# Patient Record
Sex: Male | Born: 1960 | Race: White | Hispanic: No | Marital: Married | Smoking: Never smoker
Health system: Southern US, Community
[De-identification: ages and names within clinical notes are randomized; demographics above are authoritative.]

## PROBLEM LIST (undated history)

## (undated) DIAGNOSIS — Z9889 Other specified postprocedural states: Secondary | ICD-10-CM

## (undated) DIAGNOSIS — I5032 Chronic diastolic (congestive) heart failure: Secondary | ICD-10-CM

## (undated) DIAGNOSIS — E119 Type 2 diabetes mellitus without complications: Secondary | ICD-10-CM

## (undated) DIAGNOSIS — E876 Hypokalemia: Secondary | ICD-10-CM

## (undated) DIAGNOSIS — Z794 Long term (current) use of insulin: Secondary | ICD-10-CM

## (undated) DIAGNOSIS — Z87442 Personal history of urinary calculi: Secondary | ICD-10-CM

## (undated) DIAGNOSIS — G4733 Obstructive sleep apnea (adult) (pediatric): Secondary | ICD-10-CM

## (undated) DIAGNOSIS — I10 Essential (primary) hypertension: Secondary | ICD-10-CM

## (undated) DIAGNOSIS — R351 Nocturia: Secondary | ICD-10-CM

## (undated) DIAGNOSIS — Z859 Personal history of malignant neoplasm, unspecified: Secondary | ICD-10-CM

## (undated) HISTORY — PX: NEPHRECTOMY: SHX65

## (undated) HISTORY — PX: OTHER SURGICAL HISTORY: SHX169

---

## 2018-08-31 ENCOUNTER — Encounter (HOSPITAL_COMMUNITY): Payer: Self-pay | Admitting: *Deleted

## 2018-08-31 ENCOUNTER — Emergency Department (HOSPITAL_COMMUNITY): Payer: BLUE CROSS/BLUE SHIELD

## 2018-08-31 ENCOUNTER — Emergency Department (HOSPITAL_COMMUNITY)
Admission: EM | Admit: 2018-08-31 | Discharge: 2018-08-31 | Disposition: A | Discharge status: Home / Self Care | Payer: BLUE CROSS/BLUE SHIELD | Attending: Emergency Medicine | Admitting: Emergency Medicine

## 2018-08-31 DIAGNOSIS — E1165 Type 2 diabetes mellitus with hyperglycemia: Secondary | ICD-10-CM | POA: Diagnosis not present

## 2018-08-31 DIAGNOSIS — R112 Nausea with vomiting, unspecified: Secondary | ICD-10-CM | POA: Diagnosis not present

## 2018-08-31 DIAGNOSIS — N179 Acute kidney failure, unspecified: Secondary | ICD-10-CM

## 2018-08-31 DIAGNOSIS — R197 Diarrhea, unspecified: Secondary | ICD-10-CM | POA: Insufficient documentation

## 2018-08-31 DIAGNOSIS — R739 Hyperglycemia, unspecified: Secondary | ICD-10-CM

## 2018-08-31 DIAGNOSIS — R109 Unspecified abdominal pain: Secondary | ICD-10-CM | POA: Diagnosis present

## 2018-08-31 DIAGNOSIS — E876 Hypokalemia: Secondary | ICD-10-CM

## 2018-08-31 DIAGNOSIS — I10 Essential (primary) hypertension: Secondary | ICD-10-CM | POA: Diagnosis not present

## 2018-08-31 HISTORY — DX: Essential (primary) hypertension: I10

## 2018-08-31 HISTORY — DX: Type 2 diabetes mellitus without complications: E11.9

## 2018-08-31 LAB — COMPREHENSIVE METABOLIC PANEL
ALT: 19 U/L (ref 0–44)
AST: 18 U/L (ref 15–41)
Albumin: 3.1 g/dL — ABNORMAL LOW (ref 3.5–5.0)
Alkaline Phosphatase: 72 U/L (ref 38–126)
Anion gap: 14 (ref 5–15)
BUN: 19 mg/dL (ref 6–20)
CO2: 26 mmol/L (ref 22–32)
Calcium: 8.6 mg/dL — ABNORMAL LOW (ref 8.9–10.3)
Chloride: 95 mmol/L — ABNORMAL LOW (ref 98–111)
Creatinine, Ser: 1.44 mg/dL — ABNORMAL HIGH (ref 0.61–1.24)
GFR calc Af Amer: >60 mL/min (ref >60)
GFR, EST NON AFRICAN AMERICAN: 54 mL/min — AB (ref >60)
Glucose, Bld: 342 mg/dL — ABNORMAL HIGH (ref 70–99)
POTASSIUM: 3.1 mmol/L — AB (ref 3.5–5.1)
Sodium: 135 mmol/L (ref 135–145)
Total Bilirubin: 0.7 mg/dL (ref 0.3–1.2)
Total Protein: 6.8 g/dL (ref 6.5–8.1)

## 2018-08-31 LAB — URINALYSIS, ROUTINE W REFLEX MICROSCOPIC
Bilirubin Urine: NEGATIVE
Hgb urine dipstick: NEGATIVE
Ketones, ur: NEGATIVE mg/dL
Leukocytes, UA: NEGATIVE
NITRITE: NEGATIVE
Protein, ur: 30 mg/dL — AB
Specific Gravity, Urine: 1.024 (ref 1.005–1.030)
pH: 5 (ref 5.0–8.0)

## 2018-08-31 LAB — CBC
HCT: 48.9 % (ref 39.0–52.0)
Hemoglobin: 15.8 g/dL (ref 13.0–17.0)
MCH: 26.9 pg (ref 26.0–34.0)
MCHC: 32.3 g/dL (ref 30.0–36.0)
MCV: 83.2 fL (ref 80.0–100.0)
Platelets: 396 10*3/uL (ref 150–400)
RBC: 5.88 MIL/uL — AB (ref 4.22–5.81)
RDW: 13.3 % (ref 11.5–15.5)
WBC: 13 10*3/uL — ABNORMAL HIGH (ref 4.0–10.5)
nRBC: 0 % (ref 0.0–0.2)

## 2018-08-31 LAB — LIPASE, BLOOD: Lipase: 27 U/L (ref 11–51)

## 2018-08-31 LAB — CBG MONITORING, ED: Glucose-Capillary: 293 mg/dL — ABNORMAL HIGH (ref 70–99)

## 2018-08-31 MED ORDER — ONDANSETRON 4 MG PO TBDP: 4.0000 mg | ORAL_TABLET | Freq: Three times a day (TID) | ORAL | 0 refills | Status: DC | PRN

## 2018-08-31 MED ORDER — IOHEXOL 300 MG/ML  SOLN
100.0000 mL | Freq: Once | INTRAMUSCULAR | Status: AC | PRN
  Administered 2018-08-31: 100 mL via INTRAVENOUS

## 2018-08-31 MED ORDER — POTASSIUM CHLORIDE CRYS ER 20 MEQ PO TBCR
40.0000 meq | EXTENDED_RELEASE_TABLET | Freq: Once | ORAL | Status: AC
  Administered 2018-08-31: 40 meq via ORAL
  Filled 2018-08-31: qty 2

## 2018-08-31 MED ORDER — POTASSIUM CHLORIDE CRYS ER 20 MEQ PO TBCR: 40.0000 meq | EXTENDED_RELEASE_TABLET | Freq: Every day | ORAL | 0 refills | Status: DC

## 2018-08-31 MED ORDER — SODIUM CHLORIDE 0.9 % IV BOLUS
1000.0000 mL | Freq: Once | INTRAVENOUS | Status: AC
  Administered 2018-08-31: 1000 mL via INTRAVENOUS

## 2018-08-31 MED ORDER — SODIUM CHLORIDE 0.9% FLUSH
3.0000 mL | Freq: Once | INTRAVENOUS | Status: AC
  Administered 2018-08-31: 3 mL via INTRAVENOUS

## 2018-08-31 NOTE — ED Triage Notes (Signed)
Pt in c/o n/v/d onset x 4 days with vomiting and diarrhea onset x 3 days, pt in c/o 15 or more loose stools in the last 24 hrs, pt A&O x4

## 2018-08-31 NOTE — ED Provider Notes (Signed)
[facility] EMERGENCY DEPARTMENT Provider Note   CSN: [id number] Arrival date & time: 08/31/18  1326     History   Chief Complaint Chief Complaint  Patient presents with  . Abdominal Pain    HPI Travis Deleon is a 58 y.o. male who presents with nausea, vomiting, diarrhea.  Past medical history significant for insulin-dependent diabetes, hypertension, morbid obesity, sleep apnea.  He states that he has not been feeling well since Sunday.  He is also had intermittent lower abdominal pain and chills.  Yesterday he woke up and states he had multiple episodes of diarrhea.  He estimates it is been about 20 episodes.  It is watery and nonbloody.  He has had several episodes of vomiting as well.  He started to get lightheaded and therefore he told his wife and they decided to come to the ED.  In October 2018 he was admitted for 4 days due to this same problem because of dehydration, AKI and electrolyte imbalances.  He had a normal colonoscopy in June.  Denies prior abdominal surgeries.  He denies any abdominal pain currently and states that he has abdominal pain when he has to have diarrhea.  He denies any known sick contacts.  He has not had any recent antibiotics and denies any travel.  He denies fever, chest pain, shortness of breath, decreased urination.  He has not been checking his blood sugars but has been taking his insulin and medications.  HPI  Past Medical History:  Diagnosis Date  . Diabetes mellitus without complication ([place])   . Hypertension     There are no active problems to display for this patient.   Past Surgical History:  Procedure Laterality Date  . skin cancer removal          Home Medications    Prior to Admission medications   Not on File    Family History No family history on file.  Social History Social History   Tobacco Use  . Smoking status: Never Smoker  . Smokeless tobacco: Never Used  Substance Use Topics  . Alcohol use: Not  Currently  . Drug use: Not Currently     Allergies   Patient has no known allergies.   Review of Systems Review of Systems  Constitutional: Positive for chills. Negative for fever.  Respiratory: Negative for shortness of breath.   Cardiovascular: Negative for chest pain.  Gastrointestinal: Positive for abdominal pain, diarrhea, nausea and vomiting. Negative for blood in stool and constipation.  Genitourinary: Negative for decreased urine volume, difficulty urinating and dysuria.  Musculoskeletal: Negative for back pain.  Neurological: Positive for light-headedness. Negative for syncope and weakness.  All other systems reviewed and are negative.    Physical Exam Updated Vital Signs BP 104/72   Pulse 83   Temp 98.7 F (37.1 C)   Resp 18   SpO2 99%   Physical Exam Vitals signs and nursing note reviewed.  Constitutional:      General: He is not in acute distress.    Appearance: He is well-developed. He is obese.     Comments: Calm, cooperative.  No acute distress.  Pleasant  HENT:     Head: Normocephalic and atraumatic.  Eyes:     General: No scleral icterus.       Right eye: No discharge.        Left eye: No discharge.     Conjunctiva/sclera: Conjunctivae normal.     Pupils: Pupils are equal, round, and reactive to  light.  Neck:     Musculoskeletal: Normal range of motion.  Cardiovascular:     Rate and Rhythm: Normal rate and regular rhythm.  Pulmonary:     Effort: Pulmonary effort is normal. No respiratory distress.     Breath sounds: Normal breath sounds.  Abdominal:     General: Abdomen is protuberant. There is no distension.     Tenderness: There is abdominal tenderness in the left upper quadrant and left lower quadrant.     Hernia: No hernia is present.     Comments: Difficult exam due to obesity.  He has left upper quadrant and left lower quadrant tenderness  Skin:    General: Skin is warm and dry.  Neurological:     Mental Status: He is alert and  oriented to person, place, and time.  Psychiatric:        Behavior: Behavior normal.      ED Treatments / Results  Labs (all labs ordered are listed, but only abnormal results are displayed) Labs Reviewed  COMPREHENSIVE METABOLIC PANEL - Abnormal; Notable for the following components:      Result Value   Potassium 3.1 (*)    Chloride 95 (*)    Glucose, Bld 342 (*)    Creatinine, Ser 1.44 (*)    Calcium 8.6 (*)    Albumin 3.1 (*)    GFR calc non Af Amer 54 (*)    All other components within normal limits  CBC - Abnormal; Notable for the following components:   WBC 13.0 (*)    RBC 5.88 (*)    All other components within normal limits  URINALYSIS, ROUTINE W REFLEX MICROSCOPIC - Abnormal; Notable for the following components:   Glucose, UA >=500 (*)    Protein, ur 30 (*)    Bacteria, UA RARE (*)    All other components within normal limits  CBG MONITORING, ED - Abnormal; Notable for the following components:   Glucose-Capillary 293 (*)    All other components within normal limits  LIPASE, BLOOD    EKG None  Radiology Ct Abdomen Pelvis W Contrast  Result Date: 08/31/2018 CLINICAL DATA:  58 year old male with 4 days of nausea, vomiting, diarrhea and abdominal pain. Suspect diverticulitis. EXAM: CT ABDOMEN AND PELVIS WITH CONTRAST TECHNIQUE: Multidetector CT imaging of the abdomen and pelvis was performed using the standard protocol following bolus administration of intravenous contrast. CONTRAST:  117mL OMNIPAQUE IOHEXOL 300 MG/ML  SOLN COMPARISON:  None. FINDINGS: Lower chest: The lung bases are clear. Visualized cardiac structures are within normal limits for size. No pericardial effusion. Unremarkable visualized distal thoracic esophagus. Hepatobiliary: Normal hepatic contour and morphology. No discrete hepatic lesions. Normal appearance of the gallbladder. No intra or extrahepatic biliary ductal dilatation. Pancreas: Unremarkable. No pancreatic ductal dilatation or surrounding  inflammatory changes. Spleen: Normal in size without focal abnormality. Adrenals/Urinary Tract: Nonspecific 1.4 cm right adrenal nodule. Chronic atrophy of the left kidney with a staghorn calculus occupying the majority of the renal pelvis. The right kidney is mildly hypertrophic in comparison. There is a single 2-3 mm nonobstructing stone in the upper pole collecting system. No evidence of hydronephrosis. The ureters and bladder are unremarkable. Stomach/Bowel: No evidence of obstruction or focal bowel wall thickening. Normal appendix in the right lower quadrant. The terminal ileum is unremarkable. Vascular/Lymphatic: No significant vascular findings are present. No enlarged abdominal or pelvic lymph nodes. Reproductive: Prostate is unremarkable. Other: No abdominal wall hernia or abnormality. No abdominopelvic ascites. Musculoskeletal: No acute fracture or  aggressive appearing lytic or blastic osseous lesion. IMPRESSION: 1. No acute abnormality within the abdomen or pelvis. Specifically, no evidence of diverticulosis or diverticulitis. 2. 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 3. Solitary 3 mm nonobstructing stone in the upper pole collecting system of the right kidney. 4. Nonspecific 1.4 cm right adrenal nodule. In the absence of a known primary malignancy this is statistically likely an incidental adenoma. Electronically Signed   By: Jacqulynn Cadet M.D.   On: 08/31/2018 18:53    Procedures Procedures (including critical care time)  Medications Ordered in ED Medications  sodium chloride flush (NS) 0.9 % injection 3 mL (3 mLs Intravenous Given 08/31/18 1715)  sodium chloride 0.9 % bolus 1,000 mL (0 mLs Intravenous Stopped 08/31/18 1832)  potassium chloride SA (K-DUR,KLOR-CON) CR tablet 40 mEq (40 mEq Oral Given 08/31/18 1904)  iohexol (OMNIPAQUE) 300 MG/ML solution 100 mL (100 mLs Intravenous Contrast Given 08/31/18 1835)  sodium chloride 0.9 % bolus 1,000 mL (0 mLs  Intravenous Stopped 08/31/18 2228)     Initial Impression / Assessment and Plan / ED Course  I have reviewed the triage vital signs and the nursing notes.  Pertinent labs & imaging results that were available during my care of the patient were reviewed by me and considered in my medical decision making (see chart for details).  58 year old male presents with abdominal pain, N/V/D. His vitals are normal. BP is somewhat soft here. On exam heart is regular rate and rhythm. Lungs a CTA. Abdomen is soft, and there is mild left upper and lower tenderness. Labs, UA pending. Fluids and CT abdomen ordered.  CBC is remarkable for leukocytosis (13). CMP is remarkable for hypokalemia (3.1), hyperglycemia (342), elevated SCr (1.44). UA has >500 glucose. CT abdomen is negative for acute process. Repeat CBG is 293. He feels better but still feels overall bad. He tolerated PO here but is still having ongoing diarrhea. He was given rx for Zofran and potassium and advised to f/u with his doctor for recheck of his kidney function and potassium. He was also advised to take Imodium if diarrhea isn't improving. Strict return precautions given.  Final Clinical Impressions(s) / ED Diagnoses   Final diagnoses:  AKI (acute kidney injury) ([place])  Nausea vomiting and diarrhea  Hypokalemia  Hyperglycemia    ED Discharge Orders    None       Recardo Evangelist, PA-C 08/31/18 2341    Little, Wenda Overland, MD 09/01/18 1102

## 2018-08-31 NOTE — Discharge Instructions (Signed)
Take potassium for the next 5 days Please make an appointment with your doctor to get your kidney function, potassium levels, and blood sugar rechecked Start Imodium if you are not improving by the weekend Drink plenty of fluids Return if worsening

## 2019-01-23 DIAGNOSIS — Z905 Acquired absence of kidney: Secondary | ICD-10-CM

## 2019-01-23 HISTORY — PX: LAPAROSCOPIC NEPHRECTOMY, HAND ASSISTED: SHX1929

## 2019-01-23 HISTORY — DX: Acquired absence of kidney: Z90.5

## 2020-03-31 IMAGING — CT CT ABD-PELV W/ CM
2 of 5 series · 16 of 46 positions shown, 18 images · IV contrast (omnipaque)
Comparison: None.

CLINICAL DATA: 57-year-old male with 4 days of nausea, vomiting,
diarrhea and abdominal pain. Suspect diverticulitis.

EXAM:
CT ABDOMEN AND PELVIS WITH CONTRAST
TECHNIQUE: Multidetector CT imaging of the abdomen and pelvis was performed
using the standard protocol following bolus administration of
intravenous contrast.
CONTRAST:  100mL OMNIPAQUE IOHEXOL 300 MG/ML  SOLN

[Series 3: abd/ pelvis 5.0 i30f 2 · axial · 0.98mm/px · z∈[+777,+1327]mm · 13 of 124 slices shown, 15 images]
[im 7/124  soft-tissue]
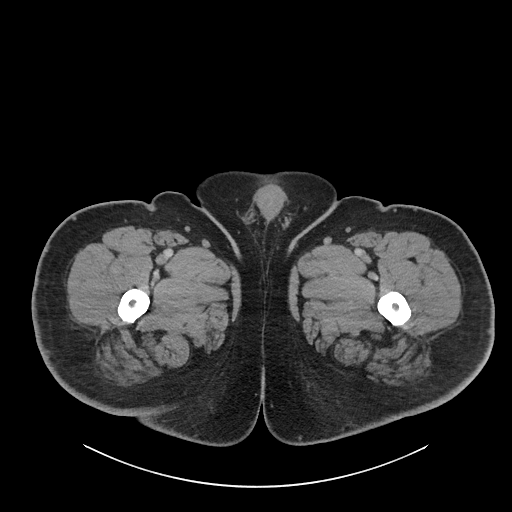
[im 7/124  bone]
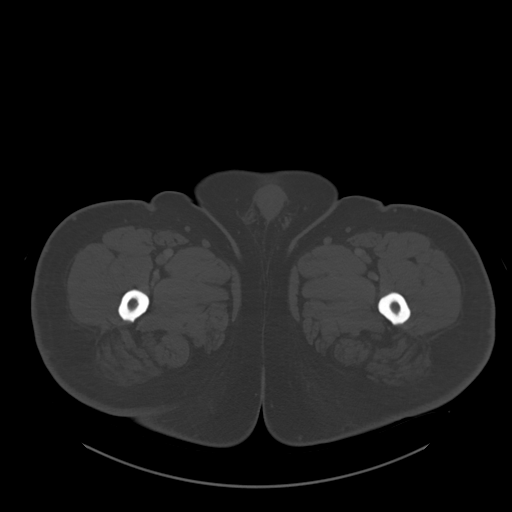
[im 20/124  soft-tissue]
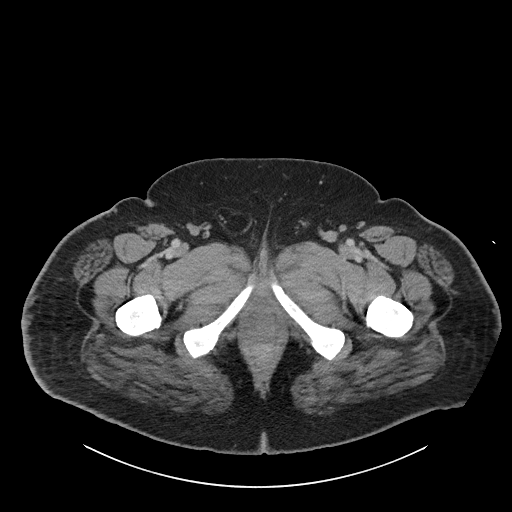
[im 26/124  soft-tissue]
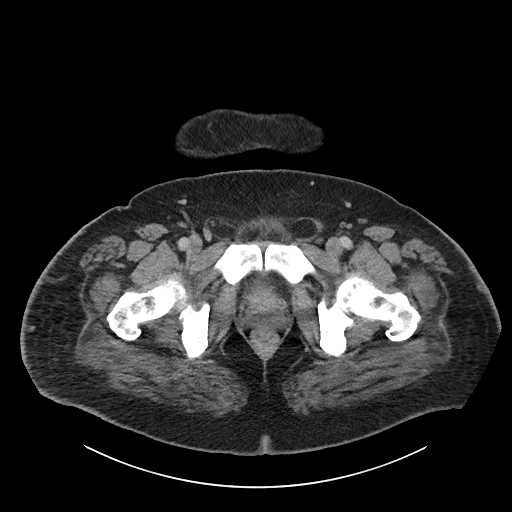
[im 33/124  soft-tissue]
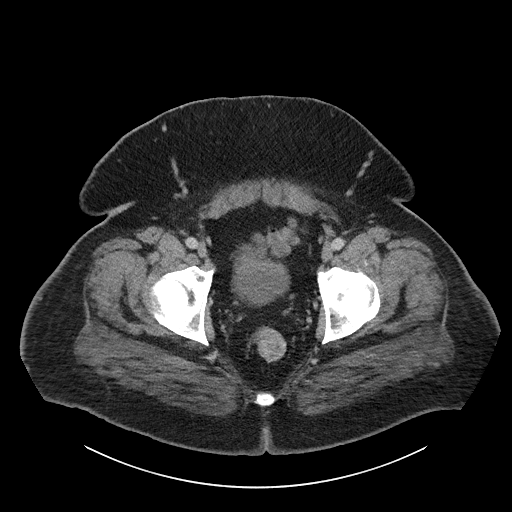
[im 46/124  soft-tissue]
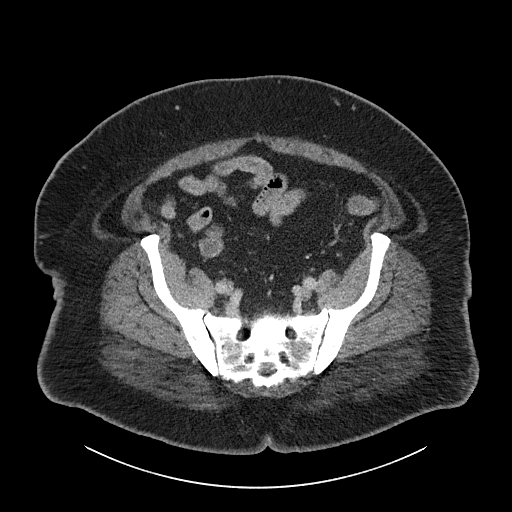
[im 52/124  soft-tissue]
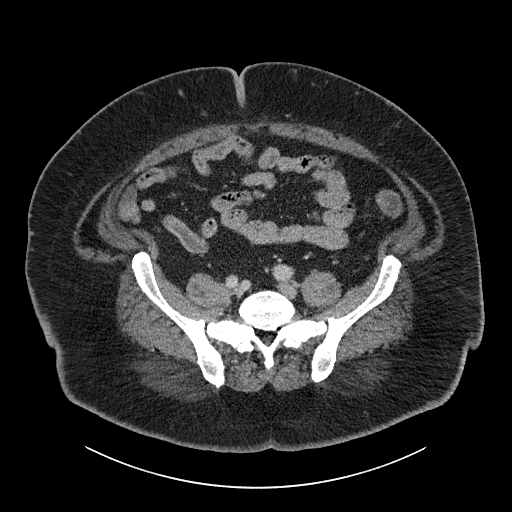
[im 65/124  soft-tissue]
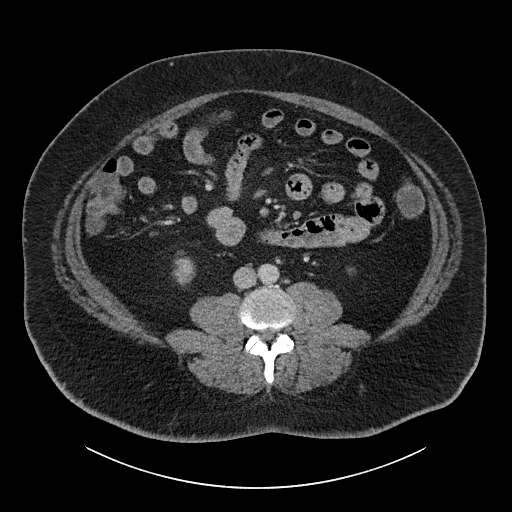
[im 72/124  soft-tissue]
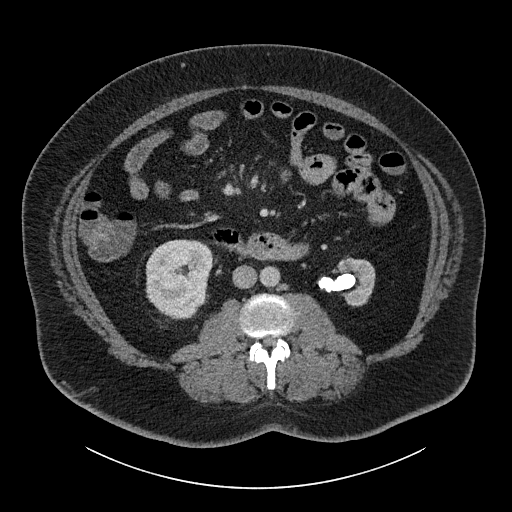
[im 78/124  soft-tissue]
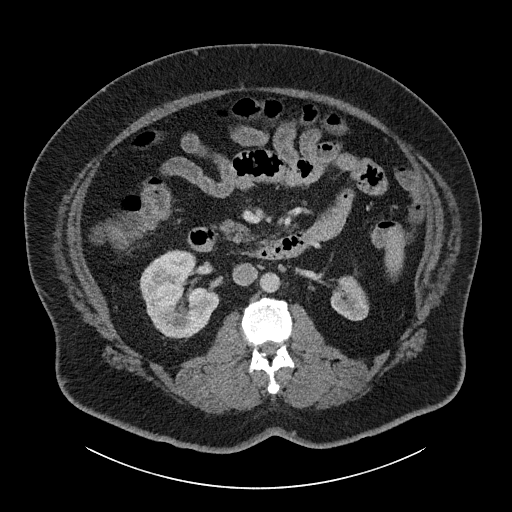
[im 78/124  bone]
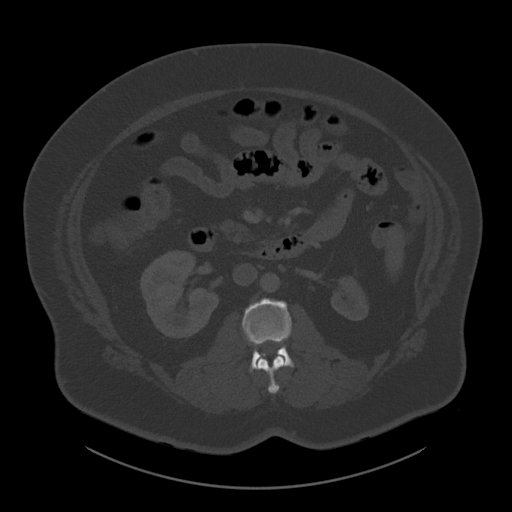
[im 91/124  soft-tissue]
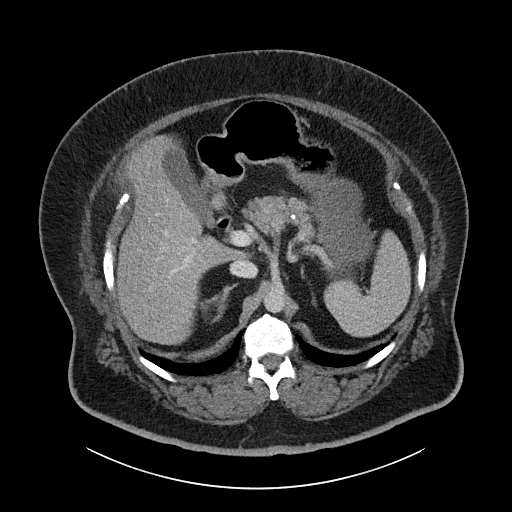
[im 98/124  soft-tissue]
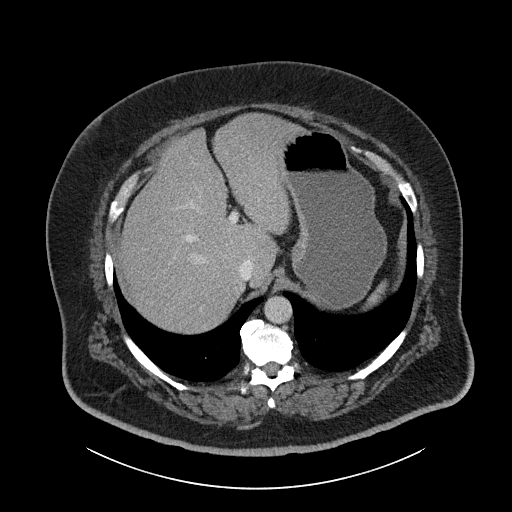
[im 104/124  soft-tissue]
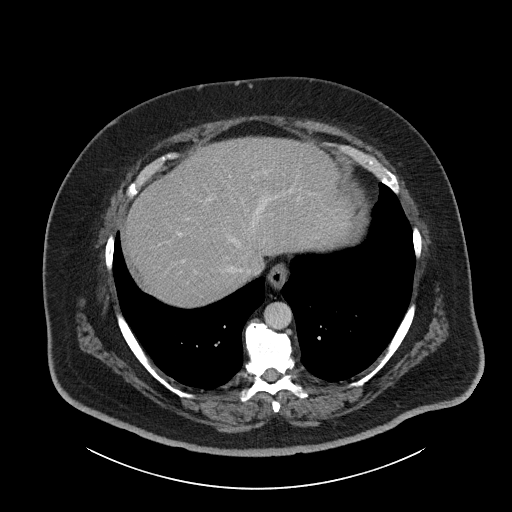
[im 117/124  soft-tissue]
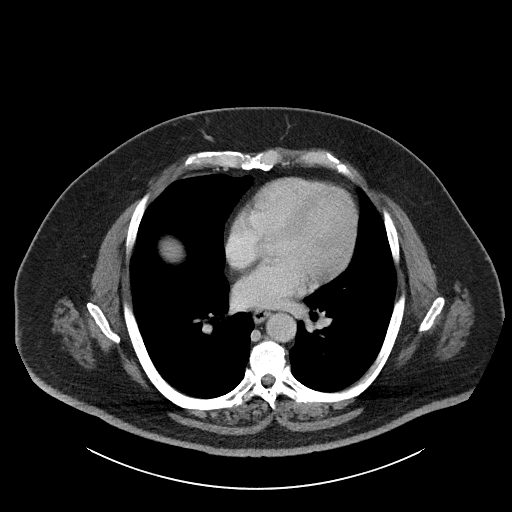

[Series 6: coronal soft tissue · coronal · 1.00mm/px · 3 of 143 slices shown]
[im 48/143  soft-tissue]
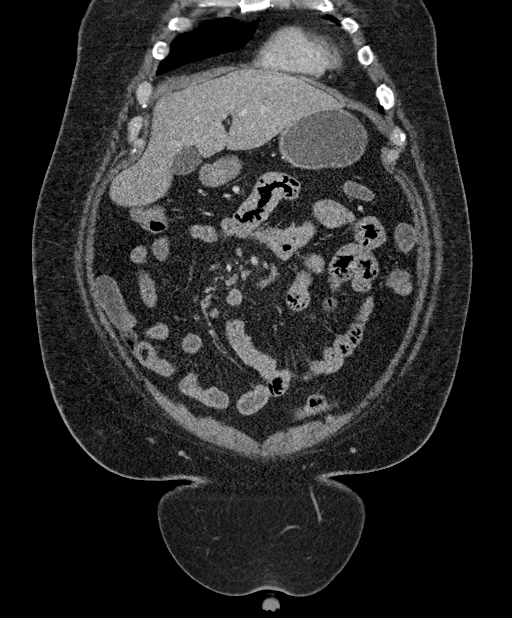
[im 64/143  soft-tissue]
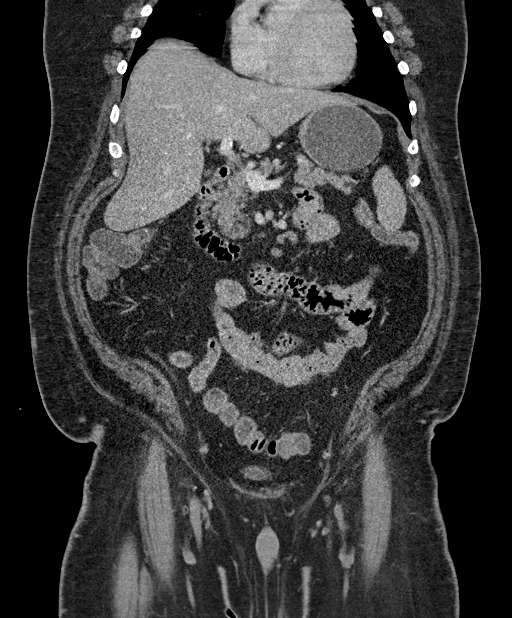
[im 79/143  soft-tissue]
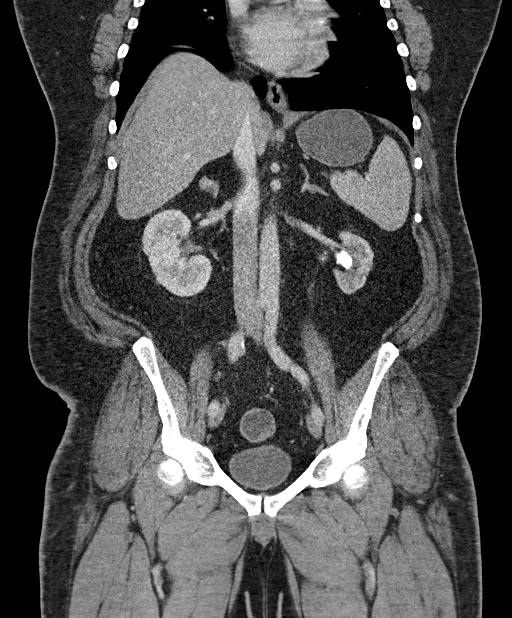

[16 of 46 positions shown; findings below may reference images not displayed]

FINDINGS: Lower chest: The lung bases are clear. Visualized cardiac structures
are within normal limits for size. No pericardial effusion.
Unremarkable visualized distal thoracic esophagus.

Hepatobiliary: Normal hepatic contour and morphology. No discrete
hepatic lesions. Normal appearance of the gallbladder. No intra or
extrahepatic biliary ductal dilatation.

Pancreas: Unremarkable. No pancreatic ductal dilatation or
surrounding inflammatory changes.

Spleen: Normal in size without focal abnormality.

Adrenals/Urinary Tract: Nonspecific 1.4 cm right adrenal nodule.
Chronic atrophy of the left kidney with a staghorn calculus
occupying the majority of the renal pelvis. The right kidney is
mildly hypertrophic in comparison. There is a single 2-3 mm
nonobstructing stone in the upper pole collecting system. No
evidence of hydronephrosis. The ureters and bladder are
unremarkable.

Stomach/Bowel: No evidence of obstruction or focal bowel wall
thickening. Normal appendix in the right lower quadrant. The
terminal ileum is unremarkable.

Vascular/Lymphatic: No significant vascular findings are present. No
enlarged abdominal or pelvic lymph nodes.

Reproductive: Prostate is unremarkable.

Other: No abdominal wall hernia or abnormality. No abdominopelvic
ascites.

Musculoskeletal: No acute fracture or aggressive appearing lytic or
blastic osseous lesion.
IMPRESSION: 1. No acute abnormality within the abdomen or pelvis. Specifically,
no evidence of diverticulosis or diverticulitis.
2. Chronic atrophy of the left kidney with a large staghorn calculus
in place.
3. Solitary 3 mm nonobstructing stone in the upper pole collecting
system of the right kidney.
4. Nonspecific 1.4 cm right adrenal nodule. In the absence of a
known primary malignancy this is statistically likely an incidental
adenoma.

## 2022-02-14 DIAGNOSIS — C61 Malignant neoplasm of prostate: Secondary | ICD-10-CM

## 2022-02-14 HISTORY — DX: Malignant neoplasm of prostate: C61

## 2022-04-16 NOTE — Progress Notes (Signed)
GU Location of Tumor / Histology: Prostate Ca  If Prostate Cancer, Gleason Score is (4 + 3) and PSA is (7.83 as of 01/2022)  Biopsies      Past/Anticipated interventions by urology, if any: NA  Past/Anticipated interventions by medical oncology, if any: NA  Weight changes, if any: Had 5-10 weight loss  IPPS:  9 SHIM:  10  Bowel/Bladder complaints, if any: No   Nausea/Vomiting, if any: No  Pain issues, if any:  0  SAFETY ISSUES: Prior radiation? No Pacemaker/ICD? No Possible current pregnancy? Male Is the patient on methotrexate?  No  Current Complaints / other details:

## 2022-04-21 ENCOUNTER — Other Ambulatory Visit: Payer: Self-pay

## 2022-04-21 ENCOUNTER — Ambulatory Visit
Admission: RE | Admit: 2022-04-21 | Discharge: 2022-04-21 | Disposition: A | Discharge status: Home / Self Care | Payer: Managed Care, Other (non HMO) | Source: Ambulatory Visit | Attending: Radiation Oncology | Admitting: Radiation Oncology

## 2022-04-21 ENCOUNTER — Ambulatory Visit: Payer: BLUE CROSS/BLUE SHIELD | Admitting: Radiation Oncology

## 2022-04-21 ENCOUNTER — Ambulatory Visit: Payer: BLUE CROSS/BLUE SHIELD

## 2022-04-21 VITALS — BP 145/83 | HR 80 | Temp 98.4°F | Resp 17 | Ht 69.0 in | Wt 284.4 lb

## 2022-04-21 DIAGNOSIS — Z794 Long term (current) use of insulin: Secondary | ICD-10-CM | POA: Insufficient documentation

## 2022-04-21 DIAGNOSIS — Z79899 Other long term (current) drug therapy: Secondary | ICD-10-CM | POA: Diagnosis not present

## 2022-04-21 DIAGNOSIS — I10 Essential (primary) hypertension: Secondary | ICD-10-CM | POA: Insufficient documentation

## 2022-04-21 DIAGNOSIS — C61 Malignant neoplasm of prostate: Secondary | ICD-10-CM | POA: Diagnosis present

## 2022-04-21 DIAGNOSIS — Z7984 Long term (current) use of oral hypoglycemic drugs: Secondary | ICD-10-CM | POA: Diagnosis not present

## 2022-04-21 DIAGNOSIS — Z85828 Personal history of other malignant neoplasm of skin: Secondary | ICD-10-CM | POA: Insufficient documentation

## 2022-04-21 DIAGNOSIS — E119 Type 2 diabetes mellitus without complications: Secondary | ICD-10-CM | POA: Insufficient documentation

## 2022-04-21 NOTE — Progress Notes (Signed)
Radiation Oncology         [phone] ________________________________  Initial Outpatient Consultation  Name: Ragan Deleon MRN: [id number]  Date: 04/21/2022  DOB: 1960/12/18  RA:[id number], Travis Alert, MD  [name], MD   REFERRING PHYSICIAN: [name], MD  DIAGNOSIS: 61 y.o. gentleman with Stage T1c adenocarcinoma of the prostate with Gleason score of 4+3, and PSA of 7.83.    ICD-10-CM   1. Malignant neoplasm of prostate ([place])  C61       HISTORY OF PRESENT ILLNESS: Travis Deleon is a 61 y.o. male with a diagnosis of prostate cancer. He was noted to have an elevated PSA of 7.6 on routine labs in 09/2021 by his primary care physician, Dr. Melford Aase.  Accordingly, he was referred for evaluation in urology by Dr. [name] on 11/03/21,  digital rectal examination was performed at that time revealing no nodularity or concerning findings.  A repeat PSA on 02/03/22 remained elevated at 7.83. Therefore, the patient proceeded to transrectal ultrasound with 12 biopsies of the prostate on 03/03/22.  The prostate volume measured 44 cc.  Out of 12 core biopsies, 4 were positive.  The maximum Gleason score was 4+3, and this was seen in the left mid, left apex, and left apex lateral. Additionally, Gleason 3+4 was noted in the left mid lateral.  The patient reviewed the biopsy results with his urologist and he has kindly been referred today for discussion of potential radiation treatment options.   PREVIOUS RADIATION THERAPY: No  PAST MEDICAL HISTORY:  Past Medical History:  Diagnosis Date   Diabetes mellitus without complication ([place])    Hypertension       PAST SURGICAL HISTORY: Past Surgical History:  Procedure Laterality Date   skin cancer removal      FAMILY HISTORY: No family history on file.  SOCIAL HISTORY:  Social History   Socioeconomic History   Marital status: Married    Spouse name: Not on file   Number of children: Not on file   Years of education: Not on file   Highest  education level: Not on file  Occupational History   Not on file  Tobacco Use   Smoking status: Never   Smokeless tobacco: Never  Substance and Sexual Activity   Alcohol use: Not Currently   Drug use: Not Currently   Sexual activity: Not on file  Other Topics Concern   Not on file  Social History Narrative   Not on file   Social Determinants of Health   Financial Resource Strain: Not on file  Food Insecurity: Not on file  Transportation Needs: Not on file  Physical Activity: Not on file  Stress: Not on file  Social Connections: Not on file  Intimate Partner Violence: Not on file    ALLERGIES: Aspirin  MEDICATIONS:  Current Outpatient Medications  Medication Sig Dispense Refill   amLODipine (NORVASC) 10 MG tablet Take 10 mg by mouth daily.     atenolol (TENORMIN) 100 MG tablet Take 100 mg by mouth daily.     eplerenone (INSPRA) 50 MG tablet Take 100 mg by mouth daily.     glipiZIDE (GLUCOTROL) 10 MG tablet Take 10 mg by mouth 2 (two) times daily.     hydrochlorothiazide (HYDRODIURIL) 50 MG tablet Take 50 mg by mouth daily.     LANTUS SOLOSTAR 100 UNIT/ML Solostar Pen Inject 58 Units into the skin 2 (two) times daily.     liraglutide (VICTOZA) 18 MG/3ML SOPN Inject 1.8 mg into the skin daily.  lisinopril (PRINIVIL,ZESTRIL) 20 MG tablet Take 40 mg by mouth daily.     metFORMIN (GLUCOPHAGE) 1000 MG tablet Take 1,000 mg by mouth 2 (two) times daily with a meal.     ondansetron (ZOFRAN ODT) 4 MG disintegrating tablet Take 1 tablet (4 mg total) by mouth every 8 (eight) hours as needed for nausea or vomiting. 8 tablet 0   potassium chloride SA (K-DUR,KLOR-CON) 20 MEQ tablet Take 2 tablets (40 mEq total) by mouth daily. 10 tablet 0   rosuvastatin (CRESTOR) 10 MG tablet Take 10 mg by mouth daily.     No current facility-administered medications for this visit.    REVIEW OF SYSTEMS:  On review of systems, the patient reports that he is doing well overall. He denies any chest  pain, shortness of breath, cough, fevers, chills, night sweats, unintended weight changes. He denies any bowel disturbances, and denies abdominal pain, nausea or vomiting. He denies any new musculoskeletal or joint aches or pains. His IPSS was 9, indicating mild-moderate urinary symptoms. His SHIM was 10, indicating he has moderate-severe erectile dysfunction. A complete review of systems is obtained and is otherwise negative.    PHYSICAL EXAM:  Wt Readings from Last 3 Encounters:  No data found for Wt   Temp Readings from Last 3 Encounters:  08/31/18 98.7 F (37.1 C)   BP Readings from Last 3 Encounters:  08/31/18 113/69   Pulse Readings from Last 3 Encounters:  08/31/18 86    /10  In general this is a well appearing Caucasian male in no acute distress. He's alert and oriented x4 and appropriate throughout the examination. Cardiopulmonary assessment is negative for acute distress, and he exhibits normal effort.     KPS = 100  100 - Normal; no complaints; no evidence of disease. 90   - Able to carry on normal activity; minor signs or symptoms of disease. 80   - Normal activity with effort; some signs or symptoms of disease. 78   - Cares for self; unable to carry on normal activity or to do active work. 60   - Requires occasional assistance, but is able to care for most of his personal needs. 50   - Requires considerable assistance and frequent medical care. 28   - Disabled; requires special care and assistance. 63   - Severely disabled; hospital admission is indicated although death not imminent. 27   - Very sick; hospital admission necessary; active supportive treatment necessary. 10   - Moribund; fatal processes progressing rapidly. 0     - Dead  Karnofsky DA, Abelmann [place], Craver LS and Burchenal [facility] [phone]) The use of the nitrogen mustards in the palliative treatment of carcinoma: with particular reference to bronchogenic carcinoma Cancer 1 634-56  LABORATORY DATA:  Lab  Results  Component Value Date   WBC 13.0 (H) 08/31/2018   HGB 15.8 08/31/2018   HCT 48.9 08/31/2018   MCV 83.2 08/31/2018   PLT 396 08/31/2018   Lab Results  Component Value Date   NA 135 08/31/2018   K 3.1 (L) 08/31/2018   CL 95 (L) 08/31/2018   CO2 26 08/31/2018   Lab Results  Component Value Date   ALT 19 08/31/2018   AST 18 08/31/2018   ALKPHOS 72 08/31/2018   BILITOT 0.7 08/31/2018     RADIOGRAPHY: No results found.    IMPRESSION/PLAN: 1. 61 y.o. gentleman with Stage T1c adenocarcinoma of the prostate with Gleason Score of 4+3, and PSA of 7.83. We discussed the  patient's workup and outlined the nature of prostate cancer in this setting. We discussed the recommendation for disease staging with a PSMA PET scan prior to proceeding with any form of treatment since his T stage, Gleason's score, and PSA put him into the unfavorable intermediate risk group. Pending there are no unanticipated findings of metastatic disease on PSMA PET, he is eligible for a variety of potential treatment options including  brachytherapy, 5.5 weeks of external radiation, or prostatectomy. We discussed the available radiation techniques, and focused on the details and logistics of delivery. We discussed and outlined the risks, benefits, short and long-term effects associated with radiotherapy and compared and contrasted these with prostatectomy. We discussed the role of SpaceOAR gel in reducing the rectal toxicity associated with radiotherapy. He appears to have a good understanding of his disease and our treatment recommendations which are of curative intent.  He and his wife were encouraged to ask questions that were answered to their stated satisfaction.  At the conclusion of our conversation, the patient remains undecided regarding his treatment preference but is interested in proceeding with the PSMA PET scan to complete his disease staging. I will share our discussion with Dr. [name] and plan to call the  patient with the results as soon as they are available. He and his wife are going to take some additional time to consider the options prior to committing to treatment. He has our contact information and will let us know if he ultimately elects to proceed with radiotherapy and we will move forward with treatment planning accordingly at that time. We enjoyed meeting him today and look forward to continuing to participate in his care.  We personally spent 70 minutes in this encounter including chart review, reviewing radiological studies, meeting face-to-face with the patient, entering orders and completing documentation.    Nicholos Johns, PA-C    Tyler Pita, MD  [facility] Direct Dial: [phone]  Fax: [phone] [email or web address]  Skype  [email or web address]

## 2022-04-21 NOTE — Progress Notes (Signed)
Introduced myself to the patient, and his wife, as the prostate nurse navigator.  No barriers to care identified at this time.  He is here to discuss his radiation treatment options, and does have a follow up to re-discuss options with Dr. [name] on 10/16.  I gave him my business card and asked him to call me with questions or concerns. I will follow up with patient after visit with Dr. [name], however, I encouraged patient to contact me if he makes a decision prior to 10/16.  Verbalized understanding.

## 2022-04-23 ENCOUNTER — Other Ambulatory Visit: Payer: Self-pay | Admitting: Urology

## 2022-04-23 DIAGNOSIS — C61 Malignant neoplasm of prostate: Secondary | ICD-10-CM

## 2022-04-28 ENCOUNTER — Encounter (HOSPITAL_COMMUNITY): Payer: Managed Care, Other (non HMO)

## 2022-04-28 ENCOUNTER — Telehealth: Payer: Self-pay | Admitting: *Deleted

## 2022-04-28 NOTE — Telephone Encounter (Signed)
Called patient to inform of PSMA Scan on 05-14-22 - arrival time- 12:30 pm @ [facility] Radiology no restrictions to test, patient to receive results from [place] on 05-15-22 @ 1 pm via telephone, spoke with patient and he is aware of these appts. and he is good with these appts.

## 2022-04-30 ENCOUNTER — Ambulatory Visit: Payer: Self-pay | Admitting: Urology

## 2022-05-14 ENCOUNTER — Encounter: Payer: Self-pay | Admitting: Urology

## 2022-05-14 ENCOUNTER — Encounter (HOSPITAL_COMMUNITY)
Admission: RE | Admit: 2022-05-14 | Discharge: 2022-05-14 | Disposition: A | Discharge status: Home / Self Care | Payer: Managed Care, Other (non HMO) | Source: Ambulatory Visit | Attending: Urology | Admitting: Urology

## 2022-05-14 DIAGNOSIS — C61 Malignant neoplasm of prostate: Secondary | ICD-10-CM | POA: Insufficient documentation

## 2022-05-14 MED ORDER — PIFLIFOLASTAT F 18 (PYLARIFY) INJECTION
9.0000 | Freq: Once | INTRAVENOUS | Status: AC
  Administered 2022-05-14: 7.8 via INTRAVENOUS

## 2022-05-14 NOTE — Progress Notes (Signed)
Telephone appointment. I verified patient's identity and began nursing interview. Patient doing well. No issues reported at this time.  Meaningful use complete. I-PSS score of 2-mild. No urinary management medications. Urology appt- Oct 16th, 2023  Patient aware of 1:00pm-05/15/22 telephone appointment w/ Ashlyn Bruning PA-C. I left my extension [phone] in case patient needs anything. Patient verbalized understanding.  Patient contact [phone]

## 2022-05-15 ENCOUNTER — Ambulatory Visit
Admission: RE | Admit: 2022-05-15 | Discharge: 2022-05-15 | Disposition: A | Discharge status: Home / Self Care | Payer: BLUE CROSS/BLUE SHIELD | Source: Ambulatory Visit | Attending: Urology | Admitting: Urology

## 2022-05-15 DIAGNOSIS — C61 Malignant neoplasm of prostate: Secondary | ICD-10-CM

## 2022-05-15 NOTE — Progress Notes (Signed)
Radiation Oncology         [phone]) [phone] ________________________________  Outpatient Follow up - Conducted via telephone  Name: Travis Deleon MRN: [id number]  Date: 05/15/2022  DOB: 01/21/1961  AC:ZYSAYT, [name] Alert, MD  [name], MD   REFERRING PHYSICIAN: [name], MD  DIAGNOSIS: 61 y.o. gentleman with Stage T1c adenocarcinoma of the prostate with Gleason score of 4+3, and PSA of 7.83.    ICD-10-CM   1. Malignant neoplasm of prostate ([place])  C61       HISTORY OF PRESENT ILLNESS: Travis Deleon is a 61 y.o. male with a diagnosis of prostate cancer. He was noted to have an elevated PSA of 7.6 on routine labs in 09/2021 by his primary care physician, Dr. Melford Aase.  Accordingly, he was referred for evaluation in urology by Dr. [name] on 11/03/21,  digital rectal examination was performed at that time revealing no nodularity or concerning findings.  A repeat PSA on 02/03/22 remained elevated at 7.83. Therefore, the patient proceeded to transrectal ultrasound with 12 biopsies of the prostate on 03/03/22.  The prostate volume measured 44 cc.  Out of 12 core biopsies, 4 were positive.  The maximum Gleason score was 4+3, and this was seen in the left mid, left apex, and left apex lateral. Additionally, Gleason 3+4 was noted in the left mid lateral.  The patient reviewed the biopsy results with his urologist and was kindly referred to [place] for discussion of potential radiation treatment options.  We initially the patient on 04/21/2022 with recommendation to proceed with PSMA PET scan to complete his disease staging prior to finalizing treatment recommendations.  His PSMA PET scan on 05/14/2022 and does show multifocal prostatic affinity consistent with his known primary prostate cancer.  There were small, bilateral stable sized external iliac nodes as well as a right paratracheal node with low tracer affinity favored to be reactive but no evidence of definitive extraprostatic disease.  We reviewed these  results by telephone today.  PREVIOUS RADIATION THERAPY: No  PAST MEDICAL HISTORY:  Past Medical History:  Diagnosis Date   Diabetes mellitus without complication ([place])    Hypertension       PAST SURGICAL HISTORY: Past Surgical History:  Procedure Laterality Date   skin cancer removal      FAMILY HISTORY: History reviewed. No pertinent family history.  SOCIAL HISTORY:  Social History   Socioeconomic History   Marital status: Married    Spouse name: Not on file   Number of children: Not on file   Years of education: Not on file   Highest education level: Not on file  Occupational History   Not on file  Tobacco Use   Smoking status: Never   Smokeless tobacco: Never  Substance and Sexual Activity   Alcohol use: Not Currently   Drug use: Not Currently   Sexual activity: Not on file  Other Topics Concern   Not on file  Social History Narrative   Not on file   Social Determinants of Health   Financial Resource Strain: Not on file  Food Insecurity: Not on file  Transportation Needs: Not on file  Physical Activity: Not on file  Stress: Not on file  Social Connections: Not on file  Intimate Partner Violence: Not on file    ALLERGIES: Aspirin  MEDICATIONS:  Current Outpatient Medications  Medication Sig Dispense Refill   amLODipine (NORVASC) 10 MG tablet Take 10 mg by mouth daily.     atenolol (TENORMIN) 100 MG tablet Take 100  mg by mouth daily.     eplerenone (INSPRA) 50 MG tablet Take 100 mg by mouth daily.     glipiZIDE (GLUCOTROL) 10 MG tablet Take 10 mg by mouth 2 (two) times daily.     hydrochlorothiazide (HYDRODIURIL) 50 MG tablet Take 50 mg by mouth daily.     insulin degludec (TRESIBA) 200 UNIT/ML FlexTouch Pen Inject 90 units SQ daily. Increase by 2 units every 3 days until fasting glucose under 120.     Insulin Pen Needle (BD PEN NEEDLE NANO U/F) 32G X 4 MM MISC by Does not apply route.     lisinopril (PRINIVIL,ZESTRIL) 20 MG tablet Take 40 mg by  mouth daily.     metFORMIN (GLUCOPHAGE) 1000 MG tablet Take 1,000 mg by mouth 2 (two) times daily with a meal.     ondansetron (ZOFRAN ODT) 4 MG disintegrating tablet Take 1 tablet (4 mg total) by mouth every 8 (eight) hours as needed for nausea or vomiting. 8 tablet 0   rosuvastatin (CRESTOR) 10 MG tablet Take 10 mg by mouth daily.     Semaglutide,0.25 or 0.'5MG'$ /DOS, (OZEMPIC, 0.25 OR 0.5 MG/DOSE,) 2 MG/3ML SOPN INJECT 0.'25MG'$  [place] WEEKLY X 4 WEEKS AND THEN INCREASE TO 0.'5MG'$  [place] WEEKLY.     No current facility-administered medications for this encounter.    REVIEW OF SYSTEMS:  On review of systems, the patient reports that he is doing well overall. He denies any chest pain, shortness of breath, cough, fevers, chills, night sweats, unintended weight changes. He denies any bowel disturbances, and denies abdominal pain, nausea or vomiting. He denies any new musculoskeletal or joint aches or pains. His IPSS was 9, indicating mild-moderate urinary symptoms. His SHIM was 10, indicating he has moderate-severe erectile dysfunction. A complete review of systems is obtained and is otherwise negative.    PHYSICAL EXAM:  Wt Readings from Last 3 Encounters:  04/21/22 284 lb 6 oz (129 kg)   Temp Readings from Last 3 Encounters:  04/21/22 98.4 F (36.9 C) (Oral)  08/31/18 98.7 F (37.1 C)   BP Readings from Last 3 Encounters:  04/21/22 (!) 145/83  08/31/18 113/69   Pulse Readings from Last 3 Encounters:  04/21/22 80  08/31/18 86   Pain Assessment Pain Score: 0-No pain/10  In general this is a well appearing Caucasian male in no acute distress. He's alert and oriented x4 and appropriate throughout the examination. Cardiopulmonary assessment is negative for acute distress, and he exhibits normal effort.     KPS = 100  100 - Normal; no complaints; no evidence of disease. 90   - Able to carry on normal activity; minor signs or symptoms of disease. 80   - Normal activity with effort; some signs or  symptoms of disease. 42   - Cares for self; unable to carry on normal activity or to do active work. 60   - Requires occasional assistance, but is able to care for most of his personal needs. 50   - Requires considerable assistance and frequent medical care. 45   - Disabled; requires special care and assistance. 80   - Severely disabled; hospital admission is indicated although death not imminent. 49   - Very sick; hospital admission necessary; active supportive treatment necessary. 10   - Moribund; fatal processes progressing rapidly. 0     - Dead  Karnofsky DA, Abelmann WH, Craver LS and Burchenal [facility] [phone]) The use of the nitrogen mustards in the palliative treatment of carcinoma: with particular reference to  bronchogenic carcinoma Cancer 1 634-56  LABORATORY DATA:  Lab Results  Component Value Date   WBC 13.0 (H) 08/31/2018   HGB 15.8 08/31/2018   HCT 48.9 08/31/2018   MCV 83.2 08/31/2018   PLT 396 08/31/2018   Lab Results  Component Value Date   NA 135 08/31/2018   K 3.1 (L) 08/31/2018   CL 95 (L) 08/31/2018   CO2 26 08/31/2018   Lab Results  Component Value Date   ALT 19 08/31/2018   AST 18 08/31/2018   ALKPHOS 72 08/31/2018   BILITOT 0.7 08/31/2018     RADIOGRAPHY: NM PET (PSMA) SKULL TO MID THIGH  Result Date: 05/15/2022 CLINICAL DATA:  New diagnosis of prostate cancer with PSA of 7.8. EXAM: NUCLEAR MEDICINE PET SKULL BASE TO THIGH TECHNIQUE: 7.8 mCi F18 Piflufolastat (Pylarify) was injected intravenously. Full-ring PET imaging was performed from the skull base to thigh after the radiotracer. CT data was obtained and used for attenuation correction and anatomic localization. COMPARISON:  08/31/2018 abdominopelvic CT. FINDINGS: NECK No radiotracer activity in neck lymph nodes. Incidental CT finding: No cervical adenopathy. CHEST A right paratracheal node measures 9 mm and a S.U.V. max of 2.8 on 85. No pulmonary parenchymal tracer affinity. Incidental CT finding: Mild  cardiomegaly. Aortic and coronary artery calcification. ABDOMEN/PELVIS Prostate: Multifocal prostatic affinity, most significant in the eccentric left apical region measuring a S.U.V. max of 5.8. Lymph nodes: A right external iliac node measures 6 mm and a S.U.V. max of 2.4 on 209/4, similar in size on 08/31/2018. A left external iliac node measures 6 mm and a S.U.V. max of 2.2 on 211/4 and is similar to on the prior CT. Liver: No evidence of liver metastasis. Incidental CT finding: Mild hepatic steatosis. Caudate and lateral segment left liver lobe enlargement with medial segment left liver lobe atrophy. Bilateral adrenal nodularity including a fat density left 1.8 cm lesion most consistent with a myelolipoma. Adrenal appearance is unchanged compared to the remote exam. Interval left nephrectomy. Abdominal aortic atherosclerosis. SKELETON No focal activity to suggest skeletal metastasis. IMPRESSION: 1. Tracer avid prostate primary or primaries. 2. Bilateral small, stable sized external iliac nodes with low-level tracer affinity. Favored to be reactive and not in the initial drainage site for prostate carcinoma. These can be re-evaluated on routine follow-up CT or PET. 3. A right paratracheal node demonstrates low-level tracer affinity and is strongly favored to be reactive, given distribution. 4. Hepatic steatosis. Morphology which is suspicious for cirrhosis. Correlate with risk factors. 5. Incidental findings, including: Coronary artery atherosclerosis. Aortic Atherosclerosis (ICD10-I70.0). A left adrenal myelolipoma. Interval left nephrectomy. Electronically Signed   By: Abigail Miyamoto M.D.   On: 05/15/2022 11:17      IMPRESSION/PLAN: 1. 61 y.o. gentleman with Stage T1c adenocarcinoma of the prostate with Gleason Score of 4+3, and PSA of 7.83. We reviewed the findings and workup thus far which is without any evidence of extra prostatic disease.  Therefore, he is eligible for a variety of potential definitive  treatment options including  brachytherapy, 5.5 weeks of external radiation, or prostatectomy. We reviewed the available radiation techniques, focusing on the details and logistics of delivery as well as the risks, benefits, short and long-term effects associated with radiotherapy. We discussed the role of SpaceOAR gel in reducing the rectal toxicity associated with radiotherapy. He appears to have a good understanding of his disease and our treatment recommendations which are of curative intent.  He and his wife were encouraged to ask questions that were  answered to their stated satisfaction.  At the conclusion of our conversation, the patient remains undecided regarding his final treatment decision but appears to be leaning towards proceeding with brachytherapy and SpaceOAR gel placement for treatment of his disease.  He plans to talk things over with his wife and finalize his decision within the next week.  He has our contact information and will let us know if he ultimately chooses to proceed with radiotherapy and we will move forward with treatment planning accordingly at that time.  I enjoyed meeting him again today and look forward to continuing to participate in his care.  I personally spent 30 minutes in this encounter including chart review, reviewing radiological studies, telephone conversation with the patient, entering orders and completing documentation.   Nicholos Johns, MMS, PA-C [facility] [place]: [phone]  Fax: [phone]

## 2022-05-25 NOTE — Progress Notes (Signed)
RN spoke with patient to follow up with decision regarding decision for his prostate cancer.   Patient remains undecided at this time, leaning towards brachytherapy.  Pt has follow up with Dr. [name] on 10/17 and plans to finalize at that time.

## 2022-06-04 ENCOUNTER — Telehealth: Payer: Self-pay | Admitting: *Deleted

## 2022-06-04 NOTE — Telephone Encounter (Signed)
CALLED PATIENT TO UPDATE, SPOKE WITH PATIENT 

## 2022-07-21 NOTE — Progress Notes (Signed)
  Radiation Oncology         [phone]) [phone] ________________________________  Name: Aundrea Higginbotham MRN: [id number]  Date: 07/23/2022  DOB: 1961/07/17  SIMULATION AND TREATMENT PLANNING NOTE PUBIC ARCH STUDY  WJ:[id number], [name] Alert, MD  [name], MD  DIAGNOSIS:  Oncology History  Malignant neoplasm of prostate ([place])  03/03/2022 Cancer Staging   Staging form: Prostate, AJCC 8th Edition - Clinical stage from 03/03/2022: Stage IIC (cT1c, cN0, cM0, PSA: 7.8, Grade Group: 3) - Signed by Freeman Caldron, PA-C on 04/21/2022 Histopathologic type: Adenocarcinoma, NOS Stage prefix: Initial diagnosis Prostate specific antigen (PSA) range: Less than 10 Gleason primary pattern: 4 Gleason secondary pattern: 3 Gleason score: 7 Histologic grading system: 5 grade system Number of biopsy cores examined: 12 Number of biopsy cores positive: 4 Location of positive needle core biopsies: One side   04/21/2022 Initial Diagnosis   Malignant neoplasm of prostate ([place])       ICD-10-CM   1. Malignant neoplasm of prostate ([place])  C61       COMPLEX SIMULATION:  The patient presented today for evaluation for possible prostate seed implant. He was brought to the radiation planning suite and placed supine on the CT couch. A 3-dimensional image study set was obtained in upload to the planning computer. There, on each axial slice, I contoured the prostate gland. Then, using three-dimensional radiation planning tools I reconstructed the prostate in view of the structures from the transperineal needle pathway to assess for possible pubic arch interference. In doing so, I did not appreciate any pubic arch interference. Also, the patient's prostate volume was estimated based on the drawn structure. The volume was 37 cc.  Given the pubic arch appearance and prostate volume, patient remains a good candidate to proceed with prostate seed implant. Today, he freely provided informed written consent to proceed.    PLAN: The  patient will undergo prostate seed implant.   ________________________________  Sheral Apley. Tammi Klippel, M.D.

## 2022-07-22 ENCOUNTER — Telehealth: Payer: Self-pay | Admitting: *Deleted

## 2022-07-22 NOTE — Telephone Encounter (Signed)
CALLED PATIENT TO REMIND OF PRE-SEED APPTS. FOR 07-23-22, SPOKE WITH PATIENT AND HE IS AWARE OF THESE APPTS.

## 2022-07-22 NOTE — Progress Notes (Signed)
Radiation Oncology         [phone] ________________________________  Outpatient Follow up- Pre-seed visit  Name: Travis Deleon MRN: [id number]  Date: 07/23/2022  DOB: 1961-05-01  UR:KYHCWC, [name] Alert, MD  [name], MD   REFERRING PHYSICIAN: [name], MD  DIAGNOSIS: 61 y.o. gentleman with Stage T1c adenocarcinoma of the prostate with Gleason score of 4+3, and PSA of 7.83.     ICD-10-CM   1. Malignant neoplasm of prostate ([place])  C61       HISTORY OF PRESENT ILLNESS: Travis Deleon is a 61 y.o. male with a diagnosis of prostate cancer. He was noted to have an elevated PSA of 7.6 on routine labs in 09/2021 by his primary care physician, Dr. Melford Aase.  Accordingly, he was referred for evaluation in urology by Dr. [name] on 11/03/21,  digital rectal examination was performed at that time revealing no nodularity or concerning findings.  A repeat PSA on 02/03/22 remained elevated at 7.83. Therefore, the patient proceeded to transrectal ultrasound with 12 biopsies of the prostate on 03/03/22.  The prostate volume measured 44 cc.  Out of 12 core biopsies, 4 were positive.  The maximum Gleason score was 4+3, and this was seen in the left mid, left apex, and left apex lateral. Additionally, Gleason 3+4 was noted in the left mid lateral.   The patient reviewed the biopsy results with his urologist and was kindly referred to [place] for discussion of potential radiation treatment options.  We initially the patient on 04/21/2022 with recommendation to proceed with PSMA PET scan to complete his disease staging prior to finalizing treatment recommendations.  His PSMA PET scan on 05/14/2022 and does show multifocal prostatic affinity consistent with his known primary prostate cancer.  There were small, bilateral stable sized external iliac nodes as well as a right paratracheal node with low tracer affinity favored to be reactive but no evidence of definitive extraprostatic disease.  We reviewed those results by  telephone on 05/15/22.  The patient reviewed the biopsy results with his urologist and was kindly referred to [place] for discussion of potential radiation treatment options. When we met with the patient on 05/15/22 and he was most interested in proceeding with brachytherapy and SpaceOAR gel placement for treatment of his disease. He is here today for his pre-procedure imaging for planning and to answer any additional questions he may have about this treatment.   PREVIOUS RADIATION THERAPY: No  PAST MEDICAL HISTORY:  Past Medical History:  Diagnosis Date   Diabetes mellitus without complication ([place])    Hypertension       PAST SURGICAL HISTORY: Past Surgical History:  Procedure Laterality Date   skin cancer removal      FAMILY HISTORY: No family history on file.  SOCIAL HISTORY:  Social History   Socioeconomic History   Marital status: Married    Spouse name: Not on file   Number of children: Not on file   Years of education: Not on file   Highest education level: Not on file  Occupational History   Not on file  Tobacco Use   Smoking status: Never   Smokeless tobacco: Never  Substance and Sexual Activity   Alcohol use: Not Currently   Drug use: Not Currently   Sexual activity: Not on file  Other Topics Concern   Not on file  Social History Narrative   Not on file   Social Determinants of Health   Financial Resource Strain: Not on file  Food Insecurity: Not  on file  Transportation Needs: Not on file  Physical Activity: Not on file  Stress: Not on file  Social Connections: Not on file  Intimate Partner Violence: Not on file    ALLERGIES: Aspirin  MEDICATIONS:  Current Outpatient Medications  Medication Sig Dispense Refill   amLODipine (NORVASC) 10 MG tablet Take 10 mg by mouth daily.     atenolol (TENORMIN) 100 MG tablet Take 100 mg by mouth daily.     eplerenone (INSPRA) 50 MG tablet Take 100 mg by mouth daily.     glipiZIDE (GLUCOTROL) 10 MG tablet Take 10 mg  by mouth 2 (two) times daily.     hydrochlorothiazide (HYDRODIURIL) 50 MG tablet Take 50 mg by mouth daily.     insulin degludec (TRESIBA) 200 UNIT/ML FlexTouch Pen Inject 90 units SQ daily. Increase by 2 units every 3 days until fasting glucose under 120.     Insulin Pen Needle (BD PEN NEEDLE NANO U/F) 32G X 4 MM MISC by Does not apply route.     lisinopril (PRINIVIL,ZESTRIL) 20 MG tablet Take 40 mg by mouth daily.     metFORMIN (GLUCOPHAGE) 1000 MG tablet Take 1,000 mg by mouth 2 (two) times daily with a meal.     ondansetron (ZOFRAN ODT) 4 MG disintegrating tablet Take 1 tablet (4 mg total) by mouth every 8 (eight) hours as needed for nausea or vomiting. 8 tablet 0   rosuvastatin (CRESTOR) 10 MG tablet Take 10 mg by mouth daily.     Semaglutide,0.25 or 0.5MG/DOS, (OZEMPIC, 0.25 OR 0.5 MG/DOSE,) 2 MG/3ML SOPN INJECT 0.25MG [place] WEEKLY X 4 WEEKS AND THEN INCREASE TO 0.5MG  WEEKLY.     No current facility-administered medications for this visit.    REVIEW OF SYSTEMS:  On review of systems, the patient reports that he is doing well overall. He denies any chest pain, shortness of breath, cough, fevers, chills, night sweats, unintended weight changes. He denies any bowel disturbances, and denies abdominal pain, nausea or vomiting. He denies any new musculoskeletal or joint aches or pains. His IPSS was 9, indicating mild-moderate urinary symptoms. His SHIM was 10, indicating he has moderate-severe erectile dysfunction. A complete review of systems is obtained and is otherwise negative.     PHYSICAL EXAM:  Wt Readings from Last 3 Encounters:  04/21/22 284 lb 6 oz (129 kg)   Temp Readings from Last 3 Encounters:  04/21/22 98.4 F (36.9 C) (Oral)  08/31/18 98.7 F (37.1 C)   BP Readings from Last 3 Encounters:  04/21/22 (!) 145/83  08/31/18 113/69   Pulse Readings from Last 3 Encounters:  04/21/22 80  08/31/18 86    /10  In general this is a well appearing Caucasian male in no acute  distress. He's alert and oriented x4 and appropriate throughout the examination. Cardiopulmonary assessment is negative for acute distress, and he exhibits normal effort.     KPS = 100  100 - Normal; no complaints; no evidence of disease. 90   - Able to carry on normal activity; minor signs or symptoms of disease. 80   - Normal activity with effort; some signs or symptoms of disease. 48   - Cares for self; unable to carry on normal activity or to do active work. 60   - Requires occasional assistance, but is able to care for most of his personal needs. 50   - Requires considerable assistance and frequent medical care. 15   - Disabled; requires special care and assistance. 30   -  Severely disabled; hospital admission is indicated although death not imminent. 14   - Very sick; hospital admission necessary; active supportive treatment necessary. 10   - Moribund; fatal processes progressing rapidly. 0     - Dead  Karnofsky DA, Abelmann [place], Craver LS and Burchenal [facility] [phone]) The use of the nitrogen mustards in the palliative treatment of carcinoma: with particular reference to bronchogenic carcinoma Cancer 1 634-56  LABORATORY DATA:  Lab Results  Component Value Date   WBC 13.0 (H) 08/31/2018   HGB 15.8 08/31/2018   HCT 48.9 08/31/2018   MCV 83.2 08/31/2018   PLT 396 08/31/2018   Lab Results  Component Value Date   NA 135 08/31/2018   K 3.1 (L) 08/31/2018   CL 95 (L) 08/31/2018   CO2 26 08/31/2018   Lab Results  Component Value Date   ALT 19 08/31/2018   AST 18 08/31/2018   ALKPHOS 72 08/31/2018   BILITOT 0.7 08/31/2018     RADIOGRAPHY: No results found.    IMPRESSION/PLAN: 1. 61 y.o. gentleman with Stage T1c adenocarcinoma of the prostate with Gleason score of 4+3, and PSA of 7.83.  The patient has elected to proceed with seed implant for treatment of his disease. We reviewed the risks, benefits, short and long-term effects associated with brachytherapy and discussed the role  of SpaceOAR in reducing the rectal toxicity associated with radiotherapy.  He appears to have a good understanding of his disease and our treatment recommendations which are of curative intent.  He was encouraged to ask questions that were answered to his stated satisfaction. He has freely signed written consent to proceed today in the office and a copy of this document will be placed in his medical record. His procedure will be scheduled for the near future in collaboration with Dr. [name] and we will see him back for his post-procedure visit approximately 3 weeks thereafter. We look forward to continuing to participate in his care. He knows that he is welcome to call with any questions or concerns at any time in the interim.  I personally spent 30 minutes in this encounter including chart review, reviewing radiological studies, meeting face-to-face with the patient, entering orders and completing documentation.    Nicholos Johns, MMS, PA-C [facility] [place]: [phone]  Fax: [phone]

## 2022-07-23 ENCOUNTER — Ambulatory Visit
Admission: RE | Admit: 2022-07-23 | Discharge: 2022-07-23 | Disposition: A | Discharge status: Home / Self Care | Payer: Managed Care, Other (non HMO) | Source: Ambulatory Visit | Attending: Urology | Admitting: Urology

## 2022-07-23 ENCOUNTER — Other Ambulatory Visit: Payer: Self-pay

## 2022-07-23 ENCOUNTER — Ambulatory Visit
Admission: RE | Admit: 2022-07-23 | Discharge: 2022-07-23 | Disposition: A | Discharge status: Home / Self Care | Payer: Managed Care, Other (non HMO) | Source: Ambulatory Visit | Attending: Radiation Oncology | Admitting: Radiation Oncology

## 2022-07-23 ENCOUNTER — Encounter: Payer: Self-pay | Admitting: Urology

## 2022-07-23 VITALS — Resp 18 | Ht 69.0 in | Wt 227.0 lb

## 2022-07-23 DIAGNOSIS — C61 Malignant neoplasm of prostate: Secondary | ICD-10-CM | POA: Diagnosis present

## 2022-07-23 NOTE — Addendum Note (Signed)
Encounter addended by: Mollie Germany, LPN on: 86/02/5197 24:29 AM  Actions taken: Clinical Note Signed

## 2022-07-23 NOTE — Progress Notes (Addendum)
Pre-seed nursing interview. Patient reports increasing urinary frequency, nocturia x5 (see I-PSS). No other issues reported at this time.  Meaningful use complete. I-PSS score of 10-moderate. Patient states "NOT taking Tamsulosin at this time." Urology appt- None currently- w/ Dr. [name] at [facility]  Resp 18   Ht '5\' 9"'$  (1.753 m)   Wt 227 lb (103 kg)   BMI 33.52 kg/m    This concludes the interview.   Leandra Kern, LPN

## 2022-08-05 ENCOUNTER — Telehealth: Payer: Self-pay | Admitting: *Deleted

## 2022-08-05 NOTE — Telephone Encounter (Signed)
RETURNED PATIENT'S PHONE CALL, SPOKE WITH PATIENT. ?

## 2022-08-12 ENCOUNTER — Emergency Department (HOSPITAL_COMMUNITY): Payer: Managed Care, Other (non HMO)

## 2022-08-12 ENCOUNTER — Inpatient Hospital Stay (HOSPITAL_COMMUNITY)
Admission: EM | Admit: 2022-08-12 | Discharge: 2022-08-16 | DRG: 871 | Disposition: A | Discharge status: Home / Self Care | Payer: Managed Care, Other (non HMO) | Attending: Internal Medicine | Admitting: Internal Medicine

## 2022-08-12 ENCOUNTER — Other Ambulatory Visit: Payer: Self-pay

## 2022-08-12 ENCOUNTER — Encounter (HOSPITAL_COMMUNITY): Payer: Self-pay | Admitting: *Deleted

## 2022-08-12 DIAGNOSIS — Z7985 Long-term (current) use of injectable non-insulin antidiabetic drugs: Secondary | ICD-10-CM

## 2022-08-12 DIAGNOSIS — C61 Malignant neoplasm of prostate: Secondary | ICD-10-CM | POA: Diagnosis present

## 2022-08-12 DIAGNOSIS — Z1152 Encounter for screening for COVID-19: Secondary | ICD-10-CM

## 2022-08-12 DIAGNOSIS — A403 Sepsis due to Streptococcus pneumoniae: Principal | ICD-10-CM | POA: Diagnosis present

## 2022-08-12 DIAGNOSIS — E1169 Type 2 diabetes mellitus with other specified complication: Secondary | ICD-10-CM | POA: Diagnosis present

## 2022-08-12 DIAGNOSIS — B953 Streptococcus pneumoniae as the cause of diseases classified elsewhere: Secondary | ICD-10-CM | POA: Insufficient documentation

## 2022-08-12 DIAGNOSIS — E1122 Type 2 diabetes mellitus with diabetic chronic kidney disease: Secondary | ICD-10-CM | POA: Diagnosis present

## 2022-08-12 DIAGNOSIS — J13 Pneumonia due to Streptococcus pneumoniae: Secondary | ICD-10-CM | POA: Diagnosis present

## 2022-08-12 DIAGNOSIS — E876 Hypokalemia: Secondary | ICD-10-CM | POA: Diagnosis not present

## 2022-08-12 DIAGNOSIS — E785 Hyperlipidemia, unspecified: Secondary | ICD-10-CM | POA: Diagnosis present

## 2022-08-12 DIAGNOSIS — N189 Chronic kidney disease, unspecified: Secondary | ICD-10-CM | POA: Diagnosis present

## 2022-08-12 DIAGNOSIS — Z794 Long term (current) use of insulin: Secondary | ICD-10-CM

## 2022-08-12 DIAGNOSIS — Z7984 Long term (current) use of oral hypoglycemic drugs: Secondary | ICD-10-CM

## 2022-08-12 DIAGNOSIS — Z6833 Body mass index (BMI) 33.0-33.9, adult: Secondary | ICD-10-CM

## 2022-08-12 DIAGNOSIS — R7881 Bacteremia: Secondary | ICD-10-CM | POA: Insufficient documentation

## 2022-08-12 DIAGNOSIS — N1831 Chronic kidney disease, stage 3a: Secondary | ICD-10-CM | POA: Diagnosis present

## 2022-08-12 DIAGNOSIS — J189 Pneumonia, unspecified organism: Secondary | ICD-10-CM | POA: Diagnosis present

## 2022-08-12 DIAGNOSIS — I129 Hypertensive chronic kidney disease with stage 1 through stage 4 chronic kidney disease, or unspecified chronic kidney disease: Secondary | ICD-10-CM | POA: Diagnosis present

## 2022-08-12 DIAGNOSIS — N179 Acute kidney failure, unspecified: Secondary | ICD-10-CM | POA: Diagnosis present

## 2022-08-12 DIAGNOSIS — Z905 Acquired absence of kidney: Secondary | ICD-10-CM

## 2022-08-12 DIAGNOSIS — E1165 Type 2 diabetes mellitus with hyperglycemia: Secondary | ICD-10-CM | POA: Diagnosis present

## 2022-08-12 DIAGNOSIS — R9431 Abnormal electrocardiogram [ECG] [EKG]: Secondary | ICD-10-CM

## 2022-08-12 DIAGNOSIS — Z85828 Personal history of other malignant neoplasm of skin: Secondary | ICD-10-CM

## 2022-08-12 DIAGNOSIS — Z886 Allergy status to analgesic agent status: Secondary | ICD-10-CM

## 2022-08-12 DIAGNOSIS — I10 Essential (primary) hypertension: Secondary | ICD-10-CM | POA: Diagnosis present

## 2022-08-12 DIAGNOSIS — Z79899 Other long term (current) drug therapy: Secondary | ICD-10-CM

## 2022-08-12 DIAGNOSIS — E878 Other disorders of electrolyte and fluid balance, not elsewhere classified: Secondary | ICD-10-CM | POA: Diagnosis present

## 2022-08-12 DIAGNOSIS — J9601 Acute respiratory failure with hypoxia: Secondary | ICD-10-CM

## 2022-08-12 DIAGNOSIS — E669 Obesity, unspecified: Secondary | ICD-10-CM | POA: Diagnosis present

## 2022-08-12 DIAGNOSIS — E860 Dehydration: Secondary | ICD-10-CM | POA: Diagnosis present

## 2022-08-12 DIAGNOSIS — R001 Bradycardia, unspecified: Secondary | ICD-10-CM | POA: Diagnosis present

## 2022-08-12 DIAGNOSIS — E119 Type 2 diabetes mellitus without complications: Secondary | ICD-10-CM | POA: Diagnosis present

## 2022-08-12 LAB — RESP PANEL BY RT-PCR (RSV, FLU A&B, COVID)  RVPGX2
Influenza A by PCR: NEGATIVE
Influenza B by PCR: NEGATIVE
Resp Syncytial Virus by PCR: NEGATIVE
SARS Coronavirus 2 by RT PCR: NEGATIVE

## 2022-08-12 MED ORDER — SODIUM CHLORIDE 0.9 % IV SOLN
1.0000 g | Freq: Once | INTRAVENOUS | Status: AC
  Administered 2022-08-13: 1 g via INTRAVENOUS
  Filled 2022-08-12: qty 10

## 2022-08-12 MED ORDER — SODIUM CHLORIDE 0.9 % IV SOLN
500.0000 mg | Freq: Once | INTRAVENOUS | Status: DC
  Filled 2022-08-12: qty 5

## 2022-08-12 NOTE — ED Triage Notes (Signed)
Pt states last Wednesday his son came home and was really sick with flu. Then he states that he got it. He did get Tamiflu and today was the last day. Pt states cough has not improved. Pt gets sob going up stairs. Sat is 90% in the ED. Denies CP.

## 2022-08-13 ENCOUNTER — Encounter (HOSPITAL_COMMUNITY): Payer: Self-pay | Admitting: Internal Medicine

## 2022-08-13 DIAGNOSIS — J13 Pneumonia due to Streptococcus pneumoniae: Secondary | ICD-10-CM | POA: Diagnosis present

## 2022-08-13 DIAGNOSIS — E1165 Type 2 diabetes mellitus with hyperglycemia: Secondary | ICD-10-CM | POA: Diagnosis present

## 2022-08-13 DIAGNOSIS — J189 Pneumonia, unspecified organism: Secondary | ICD-10-CM | POA: Diagnosis not present

## 2022-08-13 DIAGNOSIS — E669 Obesity, unspecified: Secondary | ICD-10-CM | POA: Diagnosis present

## 2022-08-13 DIAGNOSIS — J9601 Acute respiratory failure with hypoxia: Secondary | ICD-10-CM | POA: Diagnosis present

## 2022-08-13 DIAGNOSIS — N179 Acute kidney failure, unspecified: Secondary | ICD-10-CM | POA: Diagnosis present

## 2022-08-13 DIAGNOSIS — Z85828 Personal history of other malignant neoplasm of skin: Secondary | ICD-10-CM | POA: Diagnosis not present

## 2022-08-13 DIAGNOSIS — E1122 Type 2 diabetes mellitus with diabetic chronic kidney disease: Secondary | ICD-10-CM | POA: Diagnosis present

## 2022-08-13 DIAGNOSIS — Z794 Long term (current) use of insulin: Secondary | ICD-10-CM | POA: Diagnosis not present

## 2022-08-13 DIAGNOSIS — E1169 Type 2 diabetes mellitus with other specified complication: Secondary | ICD-10-CM | POA: Diagnosis present

## 2022-08-13 DIAGNOSIS — C61 Malignant neoplasm of prostate: Secondary | ICD-10-CM | POA: Diagnosis present

## 2022-08-13 DIAGNOSIS — E860 Dehydration: Secondary | ICD-10-CM | POA: Diagnosis present

## 2022-08-13 DIAGNOSIS — E876 Hypokalemia: Secondary | ICD-10-CM | POA: Diagnosis present

## 2022-08-13 DIAGNOSIS — N189 Chronic kidney disease, unspecified: Secondary | ICD-10-CM | POA: Diagnosis present

## 2022-08-13 DIAGNOSIS — N1831 Chronic kidney disease, stage 3a: Secondary | ICD-10-CM | POA: Diagnosis present

## 2022-08-13 DIAGNOSIS — Z8619 Personal history of other infectious and parasitic diseases: Secondary | ICD-10-CM

## 2022-08-13 DIAGNOSIS — Z7984 Long term (current) use of oral hypoglycemic drugs: Secondary | ICD-10-CM | POA: Diagnosis not present

## 2022-08-13 DIAGNOSIS — Z1152 Encounter for screening for COVID-19: Secondary | ICD-10-CM | POA: Diagnosis not present

## 2022-08-13 DIAGNOSIS — E878 Other disorders of electrolyte and fluid balance, not elsewhere classified: Secondary | ICD-10-CM | POA: Diagnosis present

## 2022-08-13 DIAGNOSIS — Z79899 Other long term (current) drug therapy: Secondary | ICD-10-CM | POA: Diagnosis not present

## 2022-08-13 DIAGNOSIS — E785 Hyperlipidemia, unspecified: Secondary | ICD-10-CM | POA: Diagnosis present

## 2022-08-13 DIAGNOSIS — I4891 Unspecified atrial fibrillation: Secondary | ICD-10-CM | POA: Diagnosis not present

## 2022-08-13 DIAGNOSIS — I10 Essential (primary) hypertension: Secondary | ICD-10-CM | POA: Diagnosis present

## 2022-08-13 DIAGNOSIS — Z905 Acquired absence of kidney: Secondary | ICD-10-CM | POA: Diagnosis not present

## 2022-08-13 DIAGNOSIS — Z7985 Long-term (current) use of injectable non-insulin antidiabetic drugs: Secondary | ICD-10-CM | POA: Diagnosis not present

## 2022-08-13 DIAGNOSIS — Z6833 Body mass index (BMI) 33.0-33.9, adult: Secondary | ICD-10-CM | POA: Diagnosis not present

## 2022-08-13 DIAGNOSIS — R9431 Abnormal electrocardiogram [ECG] [EKG]: Secondary | ICD-10-CM | POA: Diagnosis not present

## 2022-08-13 DIAGNOSIS — A403 Sepsis due to Streptococcus pneumoniae: Secondary | ICD-10-CM | POA: Diagnosis present

## 2022-08-13 DIAGNOSIS — Z886 Allergy status to analgesic agent status: Secondary | ICD-10-CM | POA: Diagnosis not present

## 2022-08-13 DIAGNOSIS — I129 Hypertensive chronic kidney disease with stage 1 through stage 4 chronic kidney disease, or unspecified chronic kidney disease: Secondary | ICD-10-CM | POA: Diagnosis present

## 2022-08-13 DIAGNOSIS — R001 Bradycardia, unspecified: Secondary | ICD-10-CM | POA: Diagnosis present

## 2022-08-13 HISTORY — DX: Personal history of other infectious and parasitic diseases: Z86.19

## 2022-08-13 LAB — BLOOD CULTURE ID PANEL (REFLEXED) - BCID2

## 2022-08-13 LAB — PROCALCITONIN: Procalcitonin: 4.81 ng/mL

## 2022-08-13 LAB — CBC WITH DIFFERENTIAL/PLATELET
Abs Immature Granulocytes: 0.18 10*3/uL — ABNORMAL HIGH (ref 0.00–0.07)
Basophils Absolute: 0.1 10*3/uL (ref 0.0–0.1)
Basophils Relative: 0 %
Eosinophils Absolute: 0.1 10*3/uL (ref 0.0–0.5)
Eosinophils Relative: 1 %
HCT: 44.3 % (ref 39.0–52.0)
Hemoglobin: 14.7 g/dL (ref 13.0–17.0)
Immature Granulocytes: 1 %
Lymphocytes Relative: 12 %
Lymphs Abs: 2.4 10*3/uL (ref 0.7–4.0)
MCH: 27.8 pg (ref 26.0–34.0)
MCHC: 33.2 g/dL (ref 30.0–36.0)
MCV: 83.7 fL (ref 80.0–100.0)
Monocytes Absolute: 0.7 10*3/uL (ref 0.1–1.0)
Monocytes Relative: 3 %
Neutro Abs: 15.7 10*3/uL — ABNORMAL HIGH (ref 1.7–7.7)
Neutrophils Relative %: 83 %
Platelets: 345 10*3/uL (ref 150–400)
RBC: 5.29 MIL/uL (ref 4.22–5.81)
RDW: 13.3 % (ref 11.5–15.5)
WBC: 19 10*3/uL — ABNORMAL HIGH (ref 4.0–10.5)
nRBC: 0 % (ref 0.0–0.2)

## 2022-08-13 LAB — COMPREHENSIVE METABOLIC PANEL
ALT: 18 U/L (ref 0–44)
AST: 29 U/L (ref 15–41)
Albumin: 2.8 g/dL — ABNORMAL LOW (ref 3.5–5.0)
Alkaline Phosphatase: 56 U/L (ref 38–126)
Anion gap: 14 (ref 5–15)
BUN: 32 mg/dL — ABNORMAL HIGH (ref 6–20)
CO2: 24 mmol/L (ref 22–32)
Calcium: 8.3 mg/dL — ABNORMAL LOW (ref 8.9–10.3)
Chloride: 96 mmol/L — ABNORMAL LOW (ref 98–111)
Creatinine, Ser: 1.76 mg/dL — ABNORMAL HIGH (ref 0.61–1.24)
GFR, Estimated: 44 mL/min — ABNORMAL LOW (ref >60)
Glucose, Bld: 141 mg/dL — ABNORMAL HIGH (ref 70–99)
Potassium: 2.7 mmol/L — CL (ref 3.5–5.1)
Sodium: 134 mmol/L — ABNORMAL LOW (ref 135–145)
Total Bilirubin: 1.2 mg/dL (ref 0.3–1.2)
Total Protein: 7 g/dL (ref 6.5–8.1)

## 2022-08-13 LAB — GLUCOSE, CAPILLARY
Glucose-Capillary: 398 mg/dL — ABNORMAL HIGH (ref 70–99)
Glucose-Capillary: 418 mg/dL — ABNORMAL HIGH (ref 70–99)
Glucose-Capillary: 431 mg/dL — ABNORMAL HIGH (ref 70–99)

## 2022-08-13 LAB — LACTIC ACID, PLASMA: Lactic Acid, Venous: 1.3 mmol/L (ref 0.5–1.9)

## 2022-08-13 LAB — HIV ANTIBODY (ROUTINE TESTING W REFLEX): HIV Screen 4th Generation wRfx: NONREACTIVE

## 2022-08-13 LAB — MAGNESIUM: Magnesium: 1.3 mg/dL — ABNORMAL LOW (ref 1.7–2.4)

## 2022-08-13 LAB — BRAIN NATRIURETIC PEPTIDE: B Natriuretic Peptide: 233.2 pg/mL — ABNORMAL HIGH (ref 0.0–100.0)

## 2022-08-13 MED ORDER — MORPHINE SULFATE (PF) 2 MG/ML IV SOLN: 2.0000 mg | INTRAVENOUS | Status: DC | PRN

## 2022-08-13 MED ORDER — HYDRALAZINE HCL 20 MG/ML IJ SOLN: 5.0000 mg | INTRAMUSCULAR | Status: DC | PRN

## 2022-08-13 MED ORDER — OXYCODONE HCL 5 MG PO TABS: 5.0000 mg | ORAL_TABLET | ORAL | Status: DC | PRN

## 2022-08-13 MED ORDER — SODIUM CHLORIDE 0.9 % IV BOLUS
1000.0000 mL | Freq: Once | INTRAVENOUS | Status: AC
  Administered 2022-08-13: 1000 mL via INTRAVENOUS

## 2022-08-13 MED ORDER — DOCUSATE SODIUM 100 MG PO CAPS
100.0000 mg | ORAL_CAPSULE | Freq: Two times a day (BID) | ORAL | Status: DC
  Administered 2022-08-13 – 2022-08-16 (×5): 100 mg via ORAL
  Filled 2022-08-13 (×6): qty 1

## 2022-08-13 MED ORDER — ACETAMINOPHEN 325 MG PO TABS
650.0000 mg | ORAL_TABLET | Freq: Four times a day (QID) | ORAL | Status: DC | PRN
  Administered 2022-08-15 – 2022-08-16 (×4): 650 mg via ORAL
  Filled 2022-08-13 (×4): qty 2

## 2022-08-13 MED ORDER — ACETAMINOPHEN 650 MG RE SUPP: 650.0000 mg | Freq: Four times a day (QID) | RECTAL | Status: DC | PRN

## 2022-08-13 MED ORDER — ROSUVASTATIN CALCIUM 5 MG PO TABS
10.0000 mg | ORAL_TABLET | Freq: Every evening | ORAL | Status: DC
  Administered 2022-08-13 – 2022-08-15 (×3): 10 mg via ORAL
  Filled 2022-08-13 (×3): qty 2

## 2022-08-13 MED ORDER — ALBUTEROL SULFATE (2.5 MG/3ML) 0.083% IN NEBU
2.5000 mg | INHALATION_SOLUTION | RESPIRATORY_TRACT | Status: DC | PRN
  Administered 2022-08-14: 2.5 mg via RESPIRATORY_TRACT
  Filled 2022-08-13: qty 3

## 2022-08-13 MED ORDER — MAGNESIUM OXIDE -MG SUPPLEMENT 400 (240 MG) MG PO TABS
400.0000 mg | ORAL_TABLET | Freq: Once | ORAL | Status: AC
  Administered 2022-08-13: 400 mg via ORAL
  Filled 2022-08-13: qty 1

## 2022-08-13 MED ORDER — SODIUM CHLORIDE 0.9 % IV SOLN: INTRAVENOUS | Status: DC

## 2022-08-13 MED ORDER — ATENOLOL 25 MG PO TABS
100.0000 mg | ORAL_TABLET | Freq: Every day | ORAL | Status: DC
  Administered 2022-08-13 – 2022-08-16 (×4): 100 mg via ORAL
  Filled 2022-08-13 (×4): qty 4

## 2022-08-13 MED ORDER — POTASSIUM CHLORIDE CRYS ER 20 MEQ PO TBCR
40.0000 meq | EXTENDED_RELEASE_TABLET | Freq: Once | ORAL | Status: AC
  Administered 2022-08-13: 40 meq via ORAL
  Filled 2022-08-13: qty 2

## 2022-08-13 MED ORDER — ENOXAPARIN SODIUM 40 MG/0.4ML IJ SOSY
40.0000 mg | PREFILLED_SYRINGE | INTRAMUSCULAR | Status: DC
  Administered 2022-08-13 – 2022-08-15 (×3): 40 mg via SUBCUTANEOUS
  Filled 2022-08-13 (×3): qty 0.4

## 2022-08-13 MED ORDER — METHYLPREDNISOLONE SODIUM SUCC 125 MG IJ SOLR
125.0000 mg | Freq: Once | INTRAMUSCULAR | Status: AC
Start: 2022-08-13 — End: 2022-08-13
  Administered 2022-08-13: 125 mg via INTRAVENOUS
  Filled 2022-08-13: qty 2

## 2022-08-13 MED ORDER — INSULIN GLARGINE-YFGN 100 UNIT/ML ~~LOC~~ SOLN
20.0000 [IU] | Freq: Every day | SUBCUTANEOUS | Status: DC
  Administered 2022-08-13 – 2022-08-14 (×2): 20 [IU] via SUBCUTANEOUS
  Filled 2022-08-13 (×2): qty 0.2

## 2022-08-13 MED ORDER — INSULIN ASPART 100 UNIT/ML IJ SOLN
0.0000 [IU] | Freq: Three times a day (TID) | INTRAMUSCULAR | Status: DC
  Administered 2022-08-13 – 2022-08-14 (×3): 15 [IU] via SUBCUTANEOUS
  Administered 2022-08-14: 11 [IU] via SUBCUTANEOUS
  Administered 2022-08-15 (×2): 3 [IU] via SUBCUTANEOUS
  Administered 2022-08-15: 8 [IU] via SUBCUTANEOUS
  Administered 2022-08-16: 3 [IU] via SUBCUTANEOUS

## 2022-08-13 MED ORDER — BISACODYL 5 MG PO TBEC: 5.0000 mg | DELAYED_RELEASE_TABLET | Freq: Every day | ORAL | Status: DC | PRN

## 2022-08-13 MED ORDER — INSULIN ASPART 100 UNIT/ML IJ SOLN
6.0000 [IU] | Freq: Once | INTRAMUSCULAR | Status: AC
  Administered 2022-08-13: 6 [IU] via SUBCUTANEOUS

## 2022-08-13 MED ORDER — POTASSIUM CHLORIDE 10 MEQ/100ML IV SOLN
10.0000 meq | INTRAVENOUS | Status: AC
  Administered 2022-08-13: 10 meq via INTRAVENOUS
  Filled 2022-08-13 (×3): qty 100

## 2022-08-13 MED ORDER — AMLODIPINE BESYLATE 10 MG PO TABS
10.0000 mg | ORAL_TABLET | Freq: Every evening | ORAL | Status: DC
  Administered 2022-08-13 – 2022-08-15 (×3): 10 mg via ORAL
  Filled 2022-08-13 (×3): qty 1

## 2022-08-13 MED ORDER — POLYETHYLENE GLYCOL 3350 17 G PO PACK: 17.0000 g | PACK | Freq: Every day | ORAL | Status: DC | PRN

## 2022-08-13 MED ORDER — SODIUM CHLORIDE 0.9 % IV SOLN
500.0000 mg | INTRAVENOUS | Status: DC
  Administered 2022-08-14: 500 mg via INTRAVENOUS
  Filled 2022-08-13: qty 5

## 2022-08-13 MED ORDER — ONDANSETRON HCL 4 MG PO TABS: 4.0000 mg | ORAL_TABLET | Freq: Four times a day (QID) | ORAL | Status: DC | PRN

## 2022-08-13 MED ORDER — ONDANSETRON HCL 4 MG/2ML IJ SOLN: 4.0000 mg | Freq: Four times a day (QID) | INTRAMUSCULAR | Status: DC | PRN

## 2022-08-13 MED ORDER — GUAIFENESIN ER 600 MG PO TB12: 600.0000 mg | ORAL_TABLET | Freq: Two times a day (BID) | ORAL | Status: DC | PRN

## 2022-08-13 MED ORDER — INSULIN ASPART 100 UNIT/ML IJ SOLN
0.0000 [IU] | Freq: Every day | INTRAMUSCULAR | Status: DC
  Administered 2022-08-14: 5 [IU] via SUBCUTANEOUS

## 2022-08-13 MED ORDER — SODIUM CHLORIDE 0.9 % IV SOLN
500.0000 mg | Freq: Once | INTRAVENOUS | Status: AC
  Administered 2022-08-13: 500 mg via INTRAVENOUS
  Filled 2022-08-13: qty 5

## 2022-08-13 MED ORDER — POTASSIUM CHLORIDE 10 MEQ/100ML IV SOLN
10.0000 meq | INTRAVENOUS | Status: AC
  Administered 2022-08-13: 10 meq via INTRAVENOUS
  Filled 2022-08-13: qty 100

## 2022-08-13 MED ORDER — IPRATROPIUM-ALBUTEROL 0.5-2.5 (3) MG/3ML IN SOLN
3.0000 mL | Freq: Once | RESPIRATORY_TRACT | Status: AC
  Administered 2022-08-13: 3 mL via RESPIRATORY_TRACT
  Filled 2022-08-13: qty 3

## 2022-08-13 MED ORDER — SODIUM CHLORIDE 0.9 % IV SOLN
2.0000 g | INTRAVENOUS | Status: DC
  Administered 2022-08-14: 2 g via INTRAVENOUS
  Filled 2022-08-13: qty 20

## 2022-08-13 NOTE — Plan of Care (Signed)
  Problem: Education: Goal: Knowledge of General Education information will improve Description Including pain rating scale, medication(s)/side effects and non-pharmacologic comfort measures Outcome: Progressing   

## 2022-08-13 NOTE — ED Notes (Signed)
Second attempt to give transfer of care report to inpatient RN.

## 2022-08-13 NOTE — ED Notes (Signed)
This RN attempted to give transfer of care report to inpatient RN.

## 2022-08-13 NOTE — ED Notes (Signed)
Pt up to bathroom with stable gait.  

## 2022-08-13 NOTE — H&P (Addendum)
History and Physical    Patient: Travis Deleon JSH:[id number] DOB: [birth year] DOA: 08/12/2022 DOS: the patient was seen and examined on 08/13/2022 PCP: [name], MD  Patient coming from: Home - lives with wife and 2 kids; NOK: Hansel Feinstein Manuela Schwartz [phone]   Chief Complaint: SOB  HPI: Travis Deleon is a 61 y.o. male with medical history significant of DM, HTN, HLD, and prostate CA presenting with SOB. He reports that last week his wife and son were ill.  She tested positive for Influenza A and she took Tamiflu. He also completed a course of Tamiflu. Over the weekend, he began to cough more and developed soreness across his chest and back.  + congestion.  Cough is nonproductive often.   He completed the Tamiflu Tuesday or Wednesday but the coughing was worse.  +fever to 100.7.  SOB started over the weekend.      ER Course:  Carryover, per Dr. Alcario Drought:  Pt with RLL PNA 2L O2 requirement.  Wife had flu, got sick 6 days ago. Resp pnl neg. No COPD or smoking history. Got neb, steroids. K low, probably from HCTZ      Review of Systems: As mentioned in the history of present illness. All other systems reviewed and are negative. 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 He has never used smokeless tobacco. He reports that he does not currently use alcohol. He reports that he does not currently use drugs.  Allergies  Allergen Reactions   Aspirin Hives and Rash    History reviewed. No pertinent family history.  Prior to Admission medications   Medication Sig Start Date End Date Taking? Authorizing Provider  amLODipine (NORVASC) 10 MG tablet Take 10 mg by mouth daily.    [provider]  atenolol (TENORMIN) 100 MG tablet Take  100 mg by mouth daily.    [provider]  eplerenone (INSPRA) 50 MG tablet Take 100 mg by mouth daily.    [provider]  glipiZIDE (GLUCOTROL) 10 MG tablet Take 10 mg by mouth 2 (two) times daily. 06/22/18   [provider]  hydrochlorothiazide (HYDRODIURIL) 50 MG tablet Take 50 mg by mouth daily.    [provider]  insulin degludec (TRESIBA) 200 UNIT/ML FlexTouch Pen Inject 90 units SQ daily. Increase by 2 units every 3 days until fasting glucose under 120. 04/13/22   [provider]  Insulin Pen Needle (BD PEN NEEDLE NANO U/F) 32G X 4 MM MISC by Does not apply route. 07/16/21   [provider]  lisinopril (PRINIVIL,ZESTRIL) 20 MG tablet Take 40 mg by mouth daily.    [provider]  metFORMIN (GLUCOPHAGE) 1000 MG tablet Take 1,000 mg by mouth 2 (two) times daily with a meal.    [provider]  ondansetron (ZOFRAN ODT) 4 MG disintegrating tablet Take 1 tablet (4 mg total) by mouth every 8 (eight) hours as needed for nausea or vomiting. 08/31/18   Recardo Evangelist, PA-C  rosuvastatin (CRESTOR) 10 MG tablet Take 10 mg by mouth daily.    [provider]  Semaglutide,0.25 or 0.'5MG'$ /DOS, (OZEMPIC, 0.25 OR 0.5 MG/DOSE,) 2 MG/3ML SOPN INJECT 0.'25MG'$  [place] WEEKLY X 4 WEEKS AND THEN INCREASE  TO 0.'5MG'$  [place] WEEKLY. 03/17/22   [provider]  tamsulosin (FLOMAX) 0.4 MG CAPS capsule Take 0.4 mg by mouth.    [provider]    Physical Exam: Vitals:   08/13/22 0730 08/13/22 1206 08/13/22 1405 08/13/22 1412  BP: 116/62 108/65 (!) 110/57   Pulse: 80 62 66   Resp: (!) 25 (!) 22 18   Temp:  98.1 F (36.7 C) 98.1 F (36.7 C)   TempSrc:  Oral Oral   SpO2: 93% 96% 94%   Height:    '5\' 9"'$  (1.753 m)   General:  Appears calm and comfortable and is in NAD, stable on [place] O2 Eyes:  PERRL, EOMI, normal lids, iris ENT:  grossly normal hearing, lips & tongue, mmm; appropriate dentition Neck:  no LAD, masses or  thyromegaly Cardiovascular:  RRR, no m/r/g. No LE edema.  Respiratory:   RLL rhonchi.  Mildly increased respiratory effort. Abdomen:  soft, NT, ND Skin:  no rash or induration seen on limited exam Musculoskeletal:  grossly normal tone BUE/BLE, good ROM, no bony abnormality Psychiatric:  grossly normal mood and affect, speech fluent and appropriate, AOx3 Neurologic:  CN 2-12 grossly intact, moves all extremities in coordinated fashion  Radiological Exams on Admission: Independently reviewed - see discussion in A/P where applicable  DG Chest 2 View  Result Date: 08/12/2022 CLINICAL DATA:  Cough, shortness of breath, and fever. EXAM: CHEST - 2 VIEW COMPARISON:  PET-05/14/2022 FINDINGS: The cardiac silhouette is upper limits of normal in size. There is elevation of the right hemidiaphragm. Patchy airspace opacities are present in the right lung base. The left lung is clear. No pleural effusion or pneumothorax is identified. No acute osseous abnormality is seen. IMPRESSION: Right basilar airspace disease compatible with pneumonia. Followup PA and lateral chest radiographs are recommended in 3-4 weeks following trial of antibiotic therapy to ensure resolution and exclude underlying malignancy. Electronically Signed   By: Logan Bores M.D.   On: 08/12/2022 14:23    EKG: Independently reviewed.  Afib with rate 77; nonspecific ST changes with no evidence of acute ischemia   Labs on Admission: I have personally reviewed the available labs and imaging studies at the time of the admission.  Pertinent labs:    K+ 2.7 Glucose 141 BUN 32/Creatinine 1.76/GFR 44; 22/1.24/67 in 09/2021 Albumin 2.8 BNP 233.2 Lactate 1.3 WBC 19 COVID/flu/RSV negative   Assessment and Plan: Principal Problem:   Right lower lobe pneumonia Active Problems:   Malignant neoplasm of prostate (HCC)   Acute kidney injury superimposed on chronic kidney disease ([place])   Solitary kidney, acquired   Essential hypertension    Diabetes mellitus type 2 in obese (HCC)   Hypokalemia   Dyslipidemia    RLL PNA -Patient with recent probable flu infection, completed course of Tamiflu -Now with worsening cough, myalgias, SOB -Mild tachypnea, O2 to 90% on RA, leukocytosis, but not obviously with sepsis -Infiltrate in RLL on CXR -This appears to be most likely post-viral community-acquired pneumonia.  -Influenza/COVID-19/RSV negative. -Sputum and blood cultures are pending -Will order lower respiratory tract procalcitonin level.   >0.5 indicates infection and >>0.5 indicates more serious disease.  As the procalcitonin level normalizes, it will be reasonable to consider de-escalation of antibiotic coverage.  The sensitivity of procalcitonin is variable and should not be used alone to guide treatment. -CURB-65 score is 1 - will admit the patient to Med Surg. -Pneumonia Severity Index (PSI) is Class 2, <1% mortality. -Will start Azithromycin 500 mg IV  daily and Rocephin due to no risk factors for MDR cause. -Additional complicating factors include: hypoxia; worsening of co-morbid conditions. -NS @ 75cc/hr -Fever control -Repeat CBC in am -Will add albuterol PRN -Will add Mucinex for cough  AKI on Stage 3a CKD with solitary kidney -Patient with minimal baseline compromised renal function  -Current creatinine is increased at least 1.5 times compared to baseline and presumed to have occurred within the last 7 days -Likely due to prerenal secondary to severe dehydration in the setting of infection while taking ACE, metformin, etc -IVF -Follow up renal function by BMP -Avoid ACEI and NSAIDs   DM -Will check A1c -hold Glucophage, glipizide -Continue glargine but decrease dose to 20 units daily for now -Cover with moderate-scale SSI   HTN -Continue amlodipine, atenolol -Hold Inspra, HCTZ, lisinopril  Hypokalemia -Likely related to dehydration + diuretics -Hold diuretics -Replete Mag++, K+ -Recheck BMP and Mag++  in AM  HLD -Continue rosuvastatin  Prostate CA -Stage T1c prostate adenocarcinoma, active -Planning for seed radiation  *NOTE: EKG with concern for afib.  RRR at the time of my evaluation.  Will repeat EKG in AM and add telemetry.    Advance Care Planning:   Code Status: Full Code - Code status was discussed with the patient and/or family at the time of admission.  The patient would want to receive full resuscitative measures at this time.   Consults: RT  DVT Prophylaxis: Lovenox  Family Communication: None present; he declined to have me call family at the time of admission  Severity of Illness: The appropriate patient status for this patient is INPATIENT. Inpatient status is judged to be reasonable and necessary in order to provide the required intensity of service to ensure the patient's safety. The patient's presenting symptoms, physical exam findings, and initial radiographic and laboratory data in the context of their chronic comorbidities is felt to place them at high risk for further clinical deterioration. Furthermore, it is not anticipated that the patient will be medically stable for discharge from the hospital within 2 midnights of admission.   * I certify that at the point of admission it is my clinical judgment that the patient will require inpatient hospital care spanning beyond 2 midnights from the point of admission due to high intensity of service, high risk for further deterioration and high frequency of surveillance required.*  Author: Karmen Bongo, MD 08/13/2022 2:47 PM  For on call review [email or web address].

## 2022-08-13 NOTE — ED Notes (Signed)
This RN did not receive transfer of care report for this patient.

## 2022-08-13 NOTE — ED Notes (Addendum)
Transfer of care report given over the phone to inpatient nurse, Pt well appearing upon transport.

## 2022-08-13 NOTE — Progress Notes (Signed)
PHARMACY - PHYSICIAN COMMUNICATION CRITICAL VALUE ALERT - BLOOD CULTURE IDENTIFICATION (BCID)  Travis Deleon is an 62 y.o. male who presented to [facility] on [facility]/12/2022 with a chief complaint of shortness of breath. Patient previously tested positive for influenza A and recently finished a course of tamiflu.   Assessment: 2/4 blood cultures positive for strep pneumo with no resistance, WBC elevated at 19, PCT 4.81, lactate 1.3. Last fever 12/27.   Name of physician (or Provider) Contacted: Dr. Lorin Mercy  Current antibiotics: Ceftriaxone 2g q24 and azithromycin 500 mg q 24h  Changes to prescribed antibiotics recommended:  Recommending to de-escalate antibiotics and only continue ceftriaxone 2 g q 24 hours  Results for orders placed or performed during the hospital encounter of 08/12/22  Blood Culture ID Panel (Reflexed) (Collected: 08/13/2022  2:15 AM)  Result Value Ref Range   Enterococcus faecalis NOT DETECTED NOT DETECTED   Enterococcus Faecium NOT DETECTED NOT DETECTED   Listeria monocytogenes NOT DETECTED NOT DETECTED   Staphylococcus species NOT DETECTED NOT DETECTED   Staphylococcus aureus (BCID) NOT DETECTED NOT DETECTED   Staphylococcus epidermidis NOT DETECTED NOT DETECTED   Staphylococcus lugdunensis NOT DETECTED NOT DETECTED   Streptococcus species DETECTED (A) NOT DETECTED   Streptococcus agalactiae NOT DETECTED NOT DETECTED   Streptococcus pneumoniae DETECTED (A) NOT DETECTED   Streptococcus pyogenes NOT DETECTED NOT DETECTED   A.calcoaceticus-baumannii NOT DETECTED NOT DETECTED   Bacteroides fragilis NOT DETECTED NOT DETECTED   Enterobacterales NOT DETECTED NOT DETECTED   Enterobacter cloacae complex NOT DETECTED NOT DETECTED   Escherichia coli NOT DETECTED NOT DETECTED   Klebsiella aerogenes NOT DETECTED NOT DETECTED   Klebsiella oxytoca NOT DETECTED NOT DETECTED   Klebsiella pneumoniae NOT DETECTED NOT DETECTED   Proteus species NOT DETECTED NOT DETECTED   Salmonella  species NOT DETECTED NOT DETECTED   Serratia marcescens NOT DETECTED NOT DETECTED   Haemophilus influenzae NOT DETECTED NOT DETECTED   Neisseria meningitidis NOT DETECTED NOT DETECTED   Pseudomonas aeruginosa NOT DETECTED NOT DETECTED   Stenotrophomonas maltophilia NOT DETECTED NOT DETECTED   Candida albicans NOT DETECTED NOT DETECTED   Candida auris NOT DETECTED NOT DETECTED   Candida glabrata NOT DETECTED NOT DETECTED   Candida krusei NOT DETECTED NOT DETECTED   Candida parapsilosis NOT DETECTED NOT DETECTED   Candida tropicalis NOT DETECTED NOT DETECTED   Cryptococcus neoformans/gattii NOT DETECTED NOT DETECTED    Sandford Craze, PharmD. [facility] Acute Care PGY-1  08/13/2022 5:42 PM

## 2022-08-13 NOTE — ED Provider Notes (Signed)
[place] EMERGENCY DEPARTMENT Provider Note   CSN: [id number] Arrival date & time: 08/12/22  1129     History No chief complaint on file.  *Wife was present to assist in history  Nial Hawe is a 61 y.o. male, h/o prostate neoplasm, DM2, presented today for 6d h/o shortness of breath.  Patient has sick contacts at home and is taking Tamiflu for the past 5 days to no relief.  Patient has had 1 episode of nonbloody emesis.  Patient denied headache, chest pain, palpitations, abdominal pain, vision changes, wheezing, cyanosis, diarrhea or constipation.  Patient is not normally on oxygen at home.  Patient endorses being unable to walk up stairs this morning due to shortness of breath.  - Exertional: yes - Orthopnea: no - Cough, Fever, CP, Edema: denied - Cardio/Pulm PMH: denied - Tobacco/EtOH/Drugs: denied - Meds: Insulin, HCTZ, Norvasc, atenolol, lisinopril, metformin    Home Medications Prior to Admission medications   Medication Sig Start Date End Date Taking? Authorizing Provider  amLODipine (NORVASC) 10 MG tablet Take 10 mg by mouth daily.    [provider]  atenolol (TENORMIN) 100 MG tablet Take 100 mg by mouth daily.    [provider]  eplerenone (INSPRA) 50 MG tablet Take 100 mg by mouth daily.    [provider]  glipiZIDE (GLUCOTROL) 10 MG tablet Take 10 mg by mouth 2 (two) times daily. 06/22/18   [provider]  hydrochlorothiazide (HYDRODIURIL) 50 MG tablet Take 50 mg by mouth daily.    [provider]  insulin degludec (TRESIBA) 200 UNIT/ML FlexTouch Pen Inject 90 units SQ daily. Increase by 2 units every 3 days until fasting glucose under 120. 04/13/22   [provider]  Insulin Pen Needle (BD PEN NEEDLE NANO U/F) 32G X 4 MM MISC by Does not apply route. 07/16/21   [provider]  lisinopril (PRINIVIL,ZESTRIL) 20 MG tablet Take 40 mg by mouth daily.    [provider]  metFORMIN  (GLUCOPHAGE) 1000 MG tablet Take 1,000 mg by mouth 2 (two) times daily with a meal.    [provider]  ondansetron (ZOFRAN ODT) 4 MG disintegrating tablet Take 1 tablet (4 mg total) by mouth every 8 (eight) hours as needed for nausea or vomiting. 08/31/18   Recardo Evangelist, PA-C  rosuvastatin (CRESTOR) 10 MG tablet Take 10 mg by mouth daily.    [provider]  Semaglutide,0.25 or 0.'5MG'$ /DOS, (OZEMPIC, 0.25 OR 0.5 MG/DOSE,) 2 MG/3ML SOPN INJECT 0.'25MG'$  [place] WEEKLY X 4 WEEKS AND THEN INCREASE TO 0.'5MG'$  [place] WEEKLY. 03/17/22   [provider]  tamsulosin (FLOMAX) 0.4 MG CAPS capsule Take 0.4 mg by mouth.    [provider]      Allergies    Aspirin    Review of Systems   Review of Systems  Constitutional:  Negative for chills and fever.  HENT:  Negative for congestion.   Eyes:  Negative for visual disturbance.  Respiratory:  Positive for cough and shortness of breath.   Cardiovascular:  Negative for chest pain, palpitations and leg swelling.  Gastrointestinal:  Negative for abdominal pain, constipation, diarrhea and vomiting.  Genitourinary:  Negative for dysuria.  Skin:  Negative for color change.  Neurological:  Positive for weakness. Negative for headaches.  Psychiatric/Behavioral:  Negative for behavioral problems.   All other systems reviewed and are negative.   Physical Exam Updated Vital Signs BP 133/79 (BP Location: Right Arm)   Pulse 72  Temp 99.1 F (37.3 C)   Resp 17   SpO2 97%  Physical Exam Vitals and nursing note reviewed.  Constitutional:      General: He is not in acute distress.    Appearance: He is well-developed. He is obese.  HENT:     Head: Normocephalic and atraumatic.     Nose: Nose normal.     Mouth/Throat:     Mouth: Mucous membranes are moist.  Eyes:     Extraocular Movements: Extraocular movements intact.     Conjunctiva/sclera: Conjunctivae normal.     Pupils: Pupils are equal, round, and reactive to light.   Cardiovascular:     Rate and Rhythm: Normal rate and regular rhythm.     Heart sounds: No murmur heard. Pulmonary:     Effort: Pulmonary effort is normal. No accessory muscle usage, prolonged expiration, respiratory distress or retractions.     Breath sounds: Stridor: RUL, RLL. Wheezing (Expiratory Wheezing: LUL, LLL) present.  Abdominal:     General: Abdomen is flat. Bowel sounds are normal.     Palpations: Abdomen is soft.     Tenderness: There is no abdominal tenderness.  Musculoskeletal:        General: No swelling.     Cervical back: Normal range of motion.     Right lower leg: No edema.     Left lower leg: No edema.  Skin:    General: Skin is warm and dry.     Capillary Refill: Capillary refill takes less than 2 seconds.  Neurological:     General: No focal deficit present.     Mental Status: He is alert and oriented to person, place, and time.  Psychiatric:        Mood and Affect: Mood normal.     ED Results / Procedures / Treatments   Labs (all labs ordered are listed, but only abnormal results are displayed) Labs Reviewed  BRAIN NATRIURETIC PEPTIDE - Abnormal; Notable for the following components:      Result Value   B Natriuretic Peptide 233.2 (*)    All other components within normal limits  COMPREHENSIVE METABOLIC PANEL - Abnormal; Notable for the following components:   Sodium 134 (*)    Potassium 2.7 (*)    Chloride 96 (*)    Glucose, Bld 141 (*)    BUN 32 (*)    Creatinine, Ser 1.76 (*)    Calcium 8.3 (*)    Albumin 2.8 (*)    GFR, Estimated 44 (*)    All other components within normal limits  CBC WITH DIFFERENTIAL/PLATELET - Abnormal; Notable for the following components:   WBC 19.0 (*)    Neutro Abs 15.7 (*)    Abs Immature Granulocytes 0.18 (*)    All other components within normal limits  RESP PANEL BY RT-PCR (RSV, FLU A&B, COVID)  RVPGX2  CULTURE, BLOOD (ROUTINE X 2)  CULTURE, BLOOD (ROUTINE X 2)  LACTIC ACID, PLASMA  LACTIC ACID, PLASMA   MAGNESIUM    EKG EKG Interpretation  Date/Time:  Wednesday August 12 2022 13:38:26 EST Ventricular Rate:  77 PR Interval:    QRS Duration: 92 QT Interval:  402 QTC Calculation: 454 R Axis:   -18 Text Interpretation: Atrial fibrillation with a competing junctional pacemaker with premature ventricular or aberrantly conducted complexes Cannot rule out Anterior infarct , age undetermined ST & T wave abnormality, consider inferior ischemia Abnormal ECG No previous ECGs available Confirmed by Addison Lank [phone]) on 08/13/2022 4:08:55 AM  Radiology DG Chest 2 View  Result Date: 08/12/2022 CLINICAL DATA:  Cough, shortness of breath, and fever. EXAM: CHEST - 2 VIEW COMPARISON:  PET-05/14/2022 FINDINGS: The cardiac silhouette is upper limits of normal in size. There is elevation of the right hemidiaphragm. Patchy airspace opacities are present in the right lung base. The left lung is clear. No pleural effusion or pneumothorax is identified. No acute osseous abnormality is seen. IMPRESSION: Right basilar airspace disease compatible with pneumonia. Followup PA and lateral chest radiographs are recommended in 3-4 weeks following trial of antibiotic therapy to ensure resolution and exclude underlying malignancy. Electronically Signed   By: Logan Bores M.D.   On: 08/12/2022 14:23    Procedures .Critical Care  Performed by: Chuck Hint, PA-C Authorized by: Chuck Hint, PA-C   Critical care provider statement:    Critical care time (minutes):  40   Critical care time was exclusive of:  Separately billable procedures and treating other patients   Critical care was necessary to treat or prevent imminent or life-threatening deterioration of the following conditions:  Respiratory failure and sepsis   Critical care was time spent personally by me on the following activities:  Ordering and performing treatments and interventions, ordering and review of laboratory studies, ordering and review  of radiographic studies, pulse oximetry, re-evaluation of patient's condition, review of old charts, development of treatment plan with patient or surrogate, discussions with consultants, evaluation of patient's response to treatment, examination of patient and obtaining history from patient or surrogate   I assumed direction of critical care for this patient from another provider in my specialty: no     Care discussed with: admitting provider      Medications Ordered in ED Medications  cefTRIAXone (ROCEPHIN) 1 g in sodium chloride 0.9 % 100 mL IVPB (has no administration in time range)  azithromycin (ZITHROMAX) 500 mg in sodium chloride 0.9 % 250 mL IVPB (has no administration in time range)  potassium chloride 10 mEq in 100 mL IVPB (has no administration in time range)  potassium chloride SA (KLOR-CON M) CR tablet 40 mEq (has no administration in time range)  methylPREDNISolone sodium succinate (SOLU-MEDROL) 125 mg/2 mL injection 125 mg (has no administration in time range)  ipratropium-albuterol (DUONEB) 0.5-2.5 (3) MG/3ML nebulizer solution 3 mL (3 mLs Nebulization Given 08/13/22 0523)  sodium chloride 0.9 % bolus 1,000 mL (1,000 mLs Intravenous New Bag/Given 08/13/22 0539)    ED Course/ Medical Decision Making/ A&P                           Medical Decision Making Kandee Keen 61 y.o. presented today for shortness of breath. Some life threatening, surgical, emergent diagnoses I considered were PE, CHF, DKA, Sepsis.  Review of prior external notes: 07/23/22 [place]  Unique Tests and Interpretation:  - CXR: R Basilar PNA - CBC: WBC elevated 19.0 - BMP: Hypokalemia 2.7 - BNP: elevated 233 - Lactic Acid: unremarkable - Respiratory Panel: negative  Discussion with Independent Historian: Wife  Discussion of Management of Tests: hospitalist Jennette Kettle), Deno Etienne, PA-C called the hospitalist.  Risk: High:  - hospitalization or escalation of  hospital-level care  Risk Stratification Score: none  Staffed with Will Ileene Patrick, PA-C and Addison Lank, MD  Plan: CXR was ordered and showed R basilar PNA. Labs showed leukocytosis and hypokalemia with elevated BNP. Most likely diagnosis at this time is Community PNA.  Glucose is not elevated enough  to suggest DKA. Patient does meet sepsis criteria with WBC 19, hypoxia, and infection with a source. Patient has received Rocephin and azithromycin along with potassium and fluids to correct hypokalemia and to treat for pneumonia.  Patient also received Solu-Medrol and a DuoNeb due to wheezing on exam. D-Dimer to assess for PE was considered however patient was recently diagnosed with cancer and currently has PNA which would elevate D-dimer. Wells score is currently 1 due to malignancy. Taking in consideration of patient's presentation, history, lab findings PE is unlikely at this time so we will not order the D-dimer or CTPA.  On reassessment patient stated he was doing better.  There is still slight wheezing on exam in the left lung. The next best course of action would be admission due to hypoxia secondary to PNA. Will Ileene Patrick, PA-C called the hospitalist, Jennette Kettle, and patient will be admitted. Patient verbalized agreement to this plan.  Amount and/or Complexity of Data Reviewed Labs: ordered. Radiology: ordered.  Risk Prescription drug management.    Final Clinical Impression(s) / ED Diagnoses Final diagnoses:  Acute respiratory failure with hypoxia ([place])  Community acquired pneumonia of right lower lobe of lung    Rx / DC Orders ED Discharge Orders     None         Se[place], [place] 08/13/22 0641    Fatima Blank, MD 08/13/22 [phone]

## 2022-08-14 ENCOUNTER — Inpatient Hospital Stay (HOSPITAL_COMMUNITY): Payer: Managed Care, Other (non HMO)

## 2022-08-14 DIAGNOSIS — J189 Pneumonia, unspecified organism: Secondary | ICD-10-CM | POA: Diagnosis not present

## 2022-08-14 DIAGNOSIS — R7881 Bacteremia: Secondary | ICD-10-CM | POA: Insufficient documentation

## 2022-08-14 DIAGNOSIS — I4891 Unspecified atrial fibrillation: Secondary | ICD-10-CM

## 2022-08-14 DIAGNOSIS — R9431 Abnormal electrocardiogram [ECG] [EKG]: Secondary | ICD-10-CM

## 2022-08-14 LAB — ECHOCARDIOGRAM COMPLETE
AR max vel: 3.24 cm2
AV Area VTI: 3.16 cm2
AV Area mean vel: 3.11 cm2
AV Mean grad: 3 mmHg
AV Peak grad: 6.3 mmHg
Ao pk vel: 1.25 m/s
Area-P 1/2: 4.49 cm2
Height: 69 in
S' Lateral: 3.9 cm

## 2022-08-14 LAB — CBC
HCT: 38.9 % — ABNORMAL LOW (ref 39.0–52.0)
Hemoglobin: 13.1 g/dL (ref 13.0–17.0)
MCH: 27.9 pg (ref 26.0–34.0)
MCHC: 33.7 g/dL (ref 30.0–36.0)
MCV: 82.9 fL (ref 80.0–100.0)
Platelets: 295 10*3/uL (ref 150–400)
RBC: 4.69 MIL/uL (ref 4.22–5.81)
RDW: 13.1 % (ref 11.5–15.5)
WBC: 10.3 10*3/uL (ref 4.0–10.5)
nRBC: 0 % (ref 0.0–0.2)

## 2022-08-14 LAB — EXPECTORATED SPUTUM ASSESSMENT W GRAM STAIN, RFLX TO RESP C

## 2022-08-14 LAB — BASIC METABOLIC PANEL
Anion gap: 10 (ref 5–15)
BUN: 41 mg/dL — ABNORMAL HIGH (ref 6–20)
CO2: 24 mmol/L (ref 22–32)
Calcium: 7.1 mg/dL — ABNORMAL LOW (ref 8.9–10.3)
Chloride: 100 mmol/L (ref 98–111)
Creatinine, Ser: 1.59 mg/dL — ABNORMAL HIGH (ref 0.61–1.24)
GFR, Estimated: 50 mL/min — ABNORMAL LOW (ref >60)
Glucose, Bld: 310 mg/dL — ABNORMAL HIGH (ref 70–99)
Potassium: 2.9 mmol/L — ABNORMAL LOW (ref 3.5–5.1)
Sodium: 134 mmol/L — ABNORMAL LOW (ref 135–145)

## 2022-08-14 LAB — MAGNESIUM: Magnesium: 1.6 mg/dL — ABNORMAL LOW (ref 1.7–2.4)

## 2022-08-14 LAB — HEMOGLOBIN A1C
Hgb A1c MFr Bld: 11.3 % — ABNORMAL HIGH (ref 4.8–5.6)
Mean Plasma Glucose: 278 mg/dL

## 2022-08-14 LAB — GLUCOSE, CAPILLARY
Glucose-Capillary: 294 mg/dL — ABNORMAL HIGH (ref 70–99)
Glucose-Capillary: 368 mg/dL — ABNORMAL HIGH (ref 70–99)
Glucose-Capillary: 355 mg/dL — ABNORMAL HIGH (ref 70–99)
Glucose-Capillary: 328 mg/dL — ABNORMAL HIGH (ref 70–99)
Glucose-Capillary: 352 mg/dL — ABNORMAL HIGH (ref 70–99)

## 2022-08-14 MED ORDER — INSULIN ASPART 100 UNIT/ML IJ SOLN
4.0000 [IU] | Freq: Three times a day (TID) | INTRAMUSCULAR | Status: DC
  Administered 2022-08-14: 4 [IU] via SUBCUTANEOUS

## 2022-08-14 MED ORDER — MAGNESIUM SULFATE 2 GM/50ML IV SOLN
2.0000 g | Freq: Once | INTRAVENOUS | Status: AC
  Administered 2022-08-14: 2 g via INTRAVENOUS
  Filled 2022-08-14: qty 50

## 2022-08-14 MED ORDER — POTASSIUM CHLORIDE CRYS ER 20 MEQ PO TBCR
40.0000 meq | EXTENDED_RELEASE_TABLET | Freq: Once | ORAL | Status: AC
  Administered 2022-08-14: 40 meq via ORAL
  Filled 2022-08-14: qty 2

## 2022-08-14 MED ORDER — POTASSIUM CHLORIDE CRYS ER 20 MEQ PO TBCR
20.0000 meq | EXTENDED_RELEASE_TABLET | Freq: Once | ORAL | Status: AC
  Administered 2022-08-14: 20 meq via ORAL
  Filled 2022-08-14: qty 1

## 2022-08-14 MED ORDER — SODIUM CHLORIDE 0.9 % IV SOLN
2.0000 g | INTRAVENOUS | Status: DC
  Administered 2022-08-15 – 2022-08-16 (×2): 2 g via INTRAVENOUS
  Filled 2022-08-14 (×2): qty 20

## 2022-08-14 MED ORDER — PERFLUTREN LIPID MICROSPHERE
1.0000 mL | INTRAVENOUS | Status: AC | PRN
  Administered 2022-08-14: 4 mL via INTRAVENOUS

## 2022-08-14 MED ORDER — INSULIN ASPART 100 UNIT/ML IJ SOLN: 5.0000 [IU] | Freq: Three times a day (TID) | INTRAMUSCULAR | Status: DC

## 2022-08-14 MED ORDER — GUAIFENESIN ER 600 MG PO TB12
1200.0000 mg | ORAL_TABLET | Freq: Two times a day (BID) | ORAL | Status: DC
  Administered 2022-08-14 – 2022-08-16 (×4): 1200 mg via ORAL
  Filled 2022-08-14 (×4): qty 2

## 2022-08-14 MED ORDER — INSULIN GLARGINE-YFGN 100 UNIT/ML ~~LOC~~ SOLN
20.0000 [IU] | Freq: Two times a day (BID) | SUBCUTANEOUS | Status: DC
  Administered 2022-08-14: 20 [IU] via SUBCUTANEOUS
  Filled 2022-08-14 (×3): qty 0.2

## 2022-08-14 MED ORDER — INSULIN ASPART 100 UNIT/ML IJ SOLN: 4.0000 [IU] | Freq: Three times a day (TID) | INTRAMUSCULAR | Status: DC

## 2022-08-14 MED ORDER — INSULIN ASPART 100 UNIT/ML IJ SOLN
5.0000 [IU] | Freq: Three times a day (TID) | INTRAMUSCULAR | Status: DC
  Administered 2022-08-14 – 2022-08-16 (×6): 5 [IU] via SUBCUTANEOUS

## 2022-08-14 NOTE — Consult Note (Signed)
Cardiology Consultation   Patient ID: Travis Deleon MRN: [id number]; DOB: 06-19-1961  Admit date: 08/12/2022 Date of Consult: 08/14/2022  PCP:  [name], MD   [place] Providers Cardiologist:  None      History of Present Illness:   Travis Deleon is a 61 yo male with history of DM, HTN, HLD and prostate CA admitted with dyspnea and found to be febrile with pneumonia. Sick contacts at home. He is being treated with antibiotics. EKG 08/12/22 was felt to show atrial fibrillation by the admitting team. We are asked to see him in consultation by Dr. Tyrell Antonio. I reviewed the EKG from 08/12/22 which showed sinus with artifact and PACs. EKG from today with sinus. Telemetry reviewed by me and shows sinus. Around 5:30 am there was sinus bradycardia with PACs.  He denies chest pain or palpitations. Still with dyspnea.    Past Medical History:  Diagnosis Date   Diabetes mellitus without complication ([place])    Hypertension     Past Surgical History:  Procedure Laterality Date   NEPHRECTOMY     staghorn calculus, 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   skin cancer removal       Home Medications:  Prior to Admission medications   Medication Sig Start Date End Date Taking? Authorizing Provider  amLODipine (NORVASC) 10 MG tablet Take 10 mg by mouth every evening.   Yes [provider]  atenolol (TENORMIN) 100 MG tablet Take 100 mg by mouth daily.   Yes [provider]  dextromethorphan (DELSYM) 30 MG/5ML liquid Take 30 mg by mouth 2 (two) times daily as needed for cough.   Yes [provider]  eplerenone (INSPRA) 50 MG tablet Take 100 mg by mouth daily.   Yes [provider]  glipiZIDE (GLUCOTROL) 10 MG tablet Take 20 mg by mouth daily before breakfast. 06/22/18  Yes [provider]  hydrochlorothiazide (HYDRODIURIL) 50 MG tablet Take 50 mg by mouth daily.   Yes [provider]  insulin degludec (TRESIBA) 200 UNIT/ML FlexTouch Pen Inject  90 Units into the skin daily. 04/13/22  Yes [provider]  lisinopril (PRINIVIL,ZESTRIL) 20 MG tablet Take 40 mg by mouth daily.   Yes [provider]  metFORMIN (GLUCOPHAGE) 1000 MG tablet Take 1,000 mg by mouth 2 (two) times daily with a meal.   Yes [provider]  rosuvastatin (CRESTOR) 10 MG tablet Take 10 mg by mouth every evening.   Yes [provider]  ondansetron (ZOFRAN ODT) 4 MG disintegrating tablet Take 1 tablet (4 mg total) by mouth every 8 (eight) hours as needed for nausea or vomiting. 08/31/18   Recardo Evangelist, PA-C  Semaglutide,0.25 or 0.'5MG'$ /DOS, (OZEMPIC, 0.25 OR 0.5 MG/DOSE,) 2 MG/3ML SOPN Inject 0.5 mg into the skin every Sunday. Patient not taking: Reported on 08/13/2022 03/17/22   [provider]    Inpatient Medications: Scheduled Meds:  amLODipine  10 mg Oral QPM   atenolol  100 mg Oral Daily   docusate sodium  100 mg Oral BID   enoxaparin (LOVENOX) injection  40 mg Subcutaneous Q24H   insulin aspart  0-15 Units Subcutaneous TID WC   insulin aspart  0-5 Units Subcutaneous QHS   insulin aspart  4 Units Subcutaneous TID WC   insulin glargine-yfgn  20 Units Subcutaneous Daily   rosuvastatin  10 mg Oral QPM   Continuous Infusions:  sodium chloride 75 mL/hr at 08/14/22 0610   [START ON 08/15/2022] cefTRIAXone (ROCEPHIN)  IV  PRN Meds: acetaminophen **OR** acetaminophen, albuterol, bisacodyl, guaiFENesin, hydrALAZINE, morphine injection, ondansetron **OR** ondansetron (ZOFRAN) IV, oxyCODONE, polyethylene glycol  Allergies:    Allergies  Allergen Reactions   Aspirin Hives and Rash    Social History:   Social History   Socioeconomic History   Marital status: Married    Spouse name: Not on file   Number of children: Not on file   Years of education: Not on file   Highest education level: Not on file  Occupational History   Occupation: IT department in [employer]  Tobacco Use   Smoking status: Never    Smokeless tobacco: Never  Substance and Sexual Activity   Alcohol use: Not Currently   Drug use: Not Currently   Sexual activity: Not on file  Other Topics Concern   Not on file  Social History Narrative   Not on file   Social Determinants of Health   Financial Resource Strain: Not on file  Food Insecurity: Not on file  Transportation Needs: Not on file  Physical Activity: Not on file  Stress: Not on file  Social Connections: Not on file  Intimate Partner Violence: Not on file    Family History:   History reviewed. No pertinent family history.   ROS:  Please see the history of present illness.  All other ROS reviewed and negative.     Physical Exam/Data:   Vitals:   08/14/22 0005 08/14/22 0501 08/14/22 0558 08/14/22 0857  BP:  (!) 92/53 119/79 109/61  Pulse:  65 61 67  Resp:  '16 18 17  '$ Temp:  98 F (36.7 C) 97.9 F (36.6 C) 97.8 F (36.6 C)  TempSrc:   Oral Oral  SpO2: 96% 98% 98% 92%  Height:        Intake/Output Summary (Last 24 hours) at 08/14/2022 1307 Last data filed at 08/13/2022 2300 Gross per 24 hour  Intake 480 ml  Output --  Net 480 ml      07/23/2022    9:04 AM 04/21/2022    3:30 PM  Last 3 Weights  Weight (lbs) 227 lb 284 lb 6 oz  Weight (kg) 102.967 kg 128.992 kg     Body mass index is 33.52 kg/m.  General:  Well nourished, well developed, in no acute distress HEENT: normal Neck: no JVD Vascular: No carotid bruits; Distal pulses 2+ bilaterally Cardiac:  normal S1, S2; RRR; no murmur  Lungs:  clear to auscultation bilaterally, no wheezing, rhonchi or rales  Abd: soft, nontender, no hepatomegaly  Ext: no edema Musculoskeletal:  No deformities, BUE and BLE strength normal and equal Skin: warm and dry  Neuro:  CNs 2-12 intact, no focal abnormalities noted Psych:  Normal affect   EKG:  The EKG was personally reviewed and demonstrates:  sinus Telemetry:  Telemetry was personally reviewed and demonstrates:  sinus, PACs  Relevant CV  Studies:  None   Laboratory Data:  High Sensitivity Troponin:  No results for input(s): "TROPONINIHS" in the last 720 hours.   Chemistry Recent Labs  Lab 08/13/22 0215 08/13/22 0736 08/14/22 0656  NA 134*  --  134*  K 2.7*  --  2.9*  CL 96*  --  100  CO2 24  --  24  GLUCOSE 141*  --  310*  BUN 32*  --  41*  CREATININE 1.76*  --  1.59*  CALCIUM 8.3*  --  7.1*  MG  --  1.3* 1.6*  GFRNONAA 44*  --  50*  ANIONGAP  14  --  10    Recent Labs  Lab 08/13/22 0215  PROT 7.0  ALBUMIN 2.8*  AST 29  ALT 18  ALKPHOS 56  BILITOT 1.2   Lipids No results for input(s): "CHOL", "TRIG", "HDL", "LABVLDL", "LDLCALC", "CHOLHDL" in the last 168 hours.  Hematology Recent Labs  Lab 08/13/22 0215 08/14/22 0656  WBC 19.0* 10.3  RBC 5.29 4.69  HGB 14.7 13.1  HCT 44.3 38.9*  MCV 83.7 82.9  MCH 27.8 27.9  MCHC 33.2 33.7  RDW 13.3 13.1  PLT 345 295   Thyroid No results for input(s): "TSH", "FREET4" in the last 168 hours.  BNP Recent Labs  Lab 08/13/22 0215  BNP 233.2*    DDimer No results for input(s): "DDIMER" in the last 168 hours.   Radiology/Studies:  DG Chest 2 View  Result Date: 08/12/2022 CLINICAL DATA:  Cough, shortness of breath, and fever. EXAM: CHEST - 2 VIEW COMPARISON:  PET-05/14/2022 FINDINGS: The cardiac silhouette is upper limits of normal in size. There is elevation of the right hemidiaphragm. Patchy airspace opacities are present in the right lung base. The left lung is clear. No pleural effusion or pneumothorax is identified. No acute osseous abnormality is seen. IMPRESSION: Right basilar airspace disease compatible with pneumonia. Followup PA and lateral chest radiographs are recommended in 3-4 weeks following trial of antibiotic therapy to ensure resolution and exclude underlying malignancy. Electronically Signed   By: Logan Bores M.D.   On: 08/12/2022 14:23     Assessment and Plan:   Sinus rhythm with PACs: I have reviewed all of his EKGs with our EP team.  His rhythm appears to be sinus with artifact and PACs. I do not see any evidence of atrial fibrillation. Echo ordered by primary team is pending. I would aggressively replaced his potassium.    For questions or updates, please contact [place] Please consult [email or web address] for contact info under    Signed, Lauree Chandler, MD , [facility] 08/14/2022 1:07 PM

## 2022-08-14 NOTE — Progress Notes (Addendum)
PROGRESS NOTE    Travis Deleon  JOI:[id number] DOB: 1961-04-30 DOA: 08/12/2022 PCP: [name], MD   Brief Narrative: 61 year old with past medical history significant for diabetes, hypertension, hyperlipidemia, prostate cancer presents with shortness of breath.  He reports sick contact at home his wife and son were ill, last week.  Wife tested positive for influenza.  He also completed course of Tamiflu.  Over the weekend he started to have worsening cough, soreness across his chest and back, congestion.  Nonproductive cough.  He developed a fever.  Admitted with right lower lobe pneumonia and found to have strep pneumo bacteremia.   Assessment & Plan:   Principal Problem:   Right lower lobe pneumonia Active Problems:   Malignant neoplasm of prostate ([place])   Acute kidney injury superimposed on chronic kidney disease ([place])   Solitary kidney, acquired   Essential hypertension   Diabetes mellitus type 2 in obese ([facility])   Hypokalemia   Dyslipidemia   Nonspecific abnormal electrocardiogram (ECG) (EKG)   Bacteremia due to Streptococcus pneumoniae  1-Right lower lobe pneumonia Sepsis secondary to pneumonia.  Present on admission Patient presents with fever, tachypnea, leukocytosis, source of infection PNA, Bacteremia.  Pro-calcitonin elevated.  Sputum; rare gram positive cocci WBC 19---10 -Continue with IV ceftriaxone and azithromycin.  He will need 7-10 days treatment.   AKI on stage IIIa CKD, solitary kidney AKI in the setting of dehydration, infection, while taking ACE. Continue with IV fluids Prior creatinine; 1.1--1.2 Creatinine on admission peak 1.7 Improving with IV fluids, down to 1.5   Hypokalemia: Replete orally  Hypomagnesemia: Replete IV  Diabetes type 2 with hyperglycemia uncontrolled Continue with semglee, change to BID,  sliding scale insulin Start meal coverage 5 units. SSI Holding glipizide, metformin while in patient.   Hyperlipidemia: Continue  with rosuvastatin  Prostate cancer: Stage Ic adenocarcinoma planning for CT radiation  A fib:  EKG with concern of A-fib: Repeated EKG sinus.  On coreg.  Suspect related to acute illness. Will consult cardiology Check ECHO>         Estimated body mass index is 33.52 kg/m as calculated from the following:   Height as of this encounter: '5\' 9"'$  (1.753 m).   Weight as of 07/23/22: 103 kg.   DVT prophylaxis: Lovenox Code Status: Full code Family Communication: Care discussed  with patient.  Disposition Plan:  Status is: Inpatient Remains inpatient appropriate because: management of PNA, bacteremia.     Consultants:  Cardiology   Procedures:  ECHO  Antimicrobials:    Subjective: He is feeling better, still not feeling at baseline. Report cough and SOB. Currently on 2 L oxygen.    Objective: Vitals:   08/14/22 0005 08/14/22 0501 08/14/22 0558 08/14/22 0857  BP:  (!) 92/53 119/79 109/61  Pulse:  65 61 67  Resp:  '16 18 17  '$ Temp:  98 F (36.7 C) 97.9 F (36.6 C) 97.8 F (36.6 C)  TempSrc:   Oral Oral  SpO2: 96% 98% 98% 92%  Height:        Intake/Output Summary (Last 24 hours) at 08/14/2022 1412 Last data filed at 08/13/2022 2300 Gross per 24 hour  Intake 480 ml  Output --  Net 480 ml   There were no vitals filed for this visit.  Examination:  General exam: Appears calm and comfortable  Respiratory system: BL air movement, mild ronchus. No wheezing.  Cardiovascular system: S1 & S2 heard, RRR. No JVD, murmurs, rubs, gallops or clicks. No pedal edema. Gastrointestinal system:  Abdomen is nondistended, soft and nontender. No organomegaly or masses felt. Normal bowel sounds heard. Central nervous system: Alert and oriented. No focal neurological deficits. Extremities: Symmetric 5 x 5 power.    Data Reviewed: I have personally reviewed following labs and imaging studies  CBC: Recent Labs  Lab 08/13/22 0215 08/14/22 0656  WBC 19.0* 10.3  NEUTROABS  15.7*  --   HGB 14.7 13.1  HCT 44.3 38.9*  MCV 83.7 82.9  PLT 345 893   Basic Metabolic Panel: Recent Labs  Lab 08/13/22 0215 08/13/22 0736 08/14/22 0656  NA 134*  --  134*  K 2.7*  --  2.9*  CL 96*  --  100  CO2 24  --  24  GLUCOSE 141*  --  310*  BUN 32*  --  41*  CREATININE 1.76*  --  1.59*  CALCIUM 8.3*  --  7.1*  MG  --  1.3* 1.6*   GFR: CrCl cannot be calculated (Unknown ideal weight.). Liver Function Tests: Recent Labs  Lab 08/13/22 0215  AST 29  ALT 18  ALKPHOS 56  BILITOT 1.2  PROT 7.0  ALBUMIN 2.8*   No results for input(s): "LIPASE", "AMYLASE" in the last 168 hours. No results for input(s): "AMMONIA" in the last 168 hours. Coagulation Profile: No results for input(s): "INR", "PROTIME" in the last 168 hours. Cardiac Enzymes: No results for input(s): "CKTOTAL", "CKMB", "CKMBINDEX", "TROPONINI" in the last 168 hours. BNP (last 3 results) No results for input(s): "PROBNP" in the last 8760 hours. HbA1C: Recent Labs    08/13/22 1613  HGBA1C 11.3*   CBG: Recent Labs  Lab 08/13/22 2047 08/13/22 2238 08/14/22 0600 08/14/22 0854 08/14/22 1216  GLUCAP 418* 431* 294* 368* 355*   Lipid Profile: No results for input(s): "CHOL", "HDL", "LDLCALC", "TRIG", "CHOLHDL", "LDLDIRECT" in the last 72 hours. Thyroid Function Tests: No results for input(s): "TSH", "T4TOTAL", "FREET4", "T3FREE", "THYROIDAB" in the last 72 hours. Anemia Panel: No results for input(s): "VITAMINB12", "FOLATE", "FERRITIN", "TIBC", "IRON", "RETICCTPCT" in the last 72 hours. Sepsis Labs: Recent Labs  Lab 08/13/22 0215 08/13/22 1613  PROCALCITON  --  4.81  LATICACIDVEN 1.3  --     Recent Results (from the past 240 hour(s))  Resp panel by RT-PCR (RSV, Flu A&B, Covid) Anterior Nasal Swab     Status: None   Collection Time: 08/12/22  1:37 PM   Specimen: Anterior Nasal Swab  Result Value Ref Range Status   SARS Coronavirus 2 by RT PCR NEGATIVE NEGATIVE Final    Comment:  (NOTE) SARS-CoV-2 target nucleic acids are NOT DETECTED.  The SARS-CoV-2 RNA is generally detectable in upper respiratory specimens during the acute phase of infection. The lowest concentration of SARS-CoV-2 viral copies this assay can detect is 138 copies/mL. A negative result does not preclude SARS-Cov-2 infection and should not be used as the sole basis for treatment or other patient management decisions. A negative result may occur with  improper specimen collection/handling, submission of specimen other than nasopharyngeal swab, presence of viral mutation(s) within the areas targeted by this assay, and inadequate number of viral copies(<138 copies/mL). A negative result must be combined with clinical observations, patient history, and epidemiological information. The expected result is Negative.  Fact Sheet for Patients:  EntrepreneurPulse.com.au  Fact Sheet for Healthcare Providers:  IncredibleEmployment.be  This test is no t yet approved or cleared by the [place] FDA and  has been authorized for detection and/or diagnosis of SARS-CoV-2 by FDA under an Emergency Use  Authorization (EUA). This EUA will remain  in effect (meaning this test can be used) for the duration of the COVID-19 declaration under Section 564(b)(1) of the Act, 21 U.S.C.section 360bbb-3(b)(1), unless the authorization is terminated  or revoked sooner.       Influenza A by PCR NEGATIVE NEGATIVE Final   Influenza B by PCR NEGATIVE NEGATIVE Final    Comment: (NOTE) The Xpert Xpress SARS-CoV-2/FLU/RSV plus assay is intended as an aid in the diagnosis of influenza from Nasopharyngeal swab specimens and should not be used as a sole basis for treatment. Nasal washings and aspirates are unacceptable for Xpert Xpress SARS-CoV-2/FLU/RSV testing.  Fact Sheet for Patients: EntrepreneurPulse.com.au  Fact Sheet for Healthcare  Providers: IncredibleEmployment.be  This test is not yet approved or cleared by the [place] FDA and has been authorized for detection and/or diagnosis of SARS-CoV-2 by FDA under an Emergency Use Authorization (EUA). This EUA will remain in effect (meaning this test can be used) for the duration of the COVID-19 declaration under Section 564(b)(1) of the Act, 21 U.S.C. section 360bbb-3(b)(1), unless the authorization is terminated or revoked.     Resp Syncytial Virus by PCR NEGATIVE NEGATIVE Final    Comment: (NOTE) Fact Sheet for Patients: EntrepreneurPulse.com.au  Fact Sheet for Healthcare Providers: IncredibleEmployment.be  This test is not yet approved or cleared by the [place] FDA and has been authorized for detection and/or diagnosis of SARS-CoV-2 by FDA under an Emergency Use Authorization (EUA). This EUA will remain in effect (meaning this test can be used) for the duration of the COVID-19 declaration under Section 564(b)(1) of the Act, 21 U.S.C. section 360bbb-3(b)(1), unless the authorization is terminated or revoked.  Performed at [facility], [place] [address] [place], [place] [address]   Culture, blood (routine x 2)     Status: Abnormal (Preliminary result)   Collection Time: 08/12/22  9:33 PM   Specimen: BLOOD  Result Value Ref Range Status   Specimen Description BLOOD SITE NOT SPECIFIED  Final   Special Requests   Final    BOTTLES DRAWN AEROBIC AND ANAEROBIC Blood Culture results may not be optimal due to an excessive volume of blood received in culture bottles   Culture  Setup Time   Final    GRAM POSITIVE COCCI IN PAIRS IN BOTH AEROBIC AND ANAEROBIC BOTTLES CRITICAL RESULT CALLED TO, READ BACK BY AND VERIFIED WITH: PHARMD Kable LEDFORD ON 08/13/22 @ 9509 BY DRT Performed at [facility], [place] [address], [place], [place] [address]    Culture STREPTOCOCCUS PNEUMONIAE (A)  Final    Report Status PENDING  Incomplete  Culture, blood (routine x 2)     Status: None (Preliminary result)   Collection Time: 08/13/22  2:15 AM   Specimen: BLOOD  Result Value Ref Range Status   Specimen Description BLOOD SITE NOT SPECIFIED  Final   Special Requests   Final    BOTTLES DRAWN AEROBIC AND ANAEROBIC Blood Culture adequate volume   Culture  Setup Time   Final    GRAM POSITIVE COCCI IN PAIRS IN BOTH AEROBIC AND ANAEROBIC BOTTLES CRITICAL RESULT CALLED TO, READ BACK BY AND VERIFIED WITH: PHARMD J. [facility] [id number] '@1732'$  FH    Culture   Final    GRAM POSITIVE COCCI SUSCEPTIBILITIES TO FOLLOW Performed at [facility], [place] [address], [place], [place] [address]    Report Status PENDING  Incomplete  Blood Culture ID Panel (Reflexed)     Status: Abnormal  Collection Time: 08/13/22  2:15 AM  Result Value Ref Range Status   Enterococcus faecalis NOT DETECTED NOT DETECTED Final   Enterococcus Faecium NOT DETECTED NOT DETECTED Final   Listeria monocytogenes NOT DETECTED NOT DETECTED Final   Staphylococcus species NOT DETECTED NOT DETECTED Final   Staphylococcus aureus (BCID) NOT DETECTED NOT DETECTED Final   Staphylococcus epidermidis NOT DETECTED NOT DETECTED Final   Staphylococcus lugdunensis NOT DETECTED NOT DETECTED Final   Streptococcus species DETECTED (A) NOT DETECTED Final    Comment: CRITICAL RESULT CALLED TO, READ BACK BY AND VERIFIED WITH: PHARMD J. [facility] [id number] '@1732'$  FH    Streptococcus agalactiae NOT DETECTED NOT DETECTED Final   Streptococcus pneumoniae DETECTED (A) NOT DETECTED Final    Comment: CRITICAL RESULT CALLED TO, READ BACK BY AND VERIFIED WITH: PHARMD J. [facility] [id number] '@1732'$  FH    Streptococcus pyogenes NOT DETECTED NOT DETECTED Final   A.calcoaceticus-baumannii NOT DETECTED NOT DETECTED Final   Bacteroides fragilis NOT DETECTED NOT DETECTED Final   Enterobacterales NOT DETECTED NOT DETECTED Final   Enterobacter cloacae complex NOT DETECTED NOT  DETECTED Final   Escherichia coli NOT DETECTED NOT DETECTED Final   Klebsiella aerogenes NOT DETECTED NOT DETECTED Final   Klebsiella oxytoca NOT DETECTED NOT DETECTED Final   Klebsiella pneumoniae NOT DETECTED NOT DETECTED Final   Proteus species NOT DETECTED NOT DETECTED Final   Salmonella species NOT DETECTED NOT DETECTED Final   Serratia marcescens NOT DETECTED NOT DETECTED Final   Haemophilus influenzae NOT DETECTED NOT DETECTED Final   Neisseria meningitidis NOT DETECTED NOT DETECTED Final   Pseudomonas aeruginosa NOT DETECTED NOT DETECTED Final   Stenotrophomonas maltophilia NOT DETECTED NOT DETECTED Final   Candida albicans NOT DETECTED NOT DETECTED Final   Candida auris NOT DETECTED NOT DETECTED Final   Candida glabrata NOT DETECTED NOT DETECTED Final   Candida krusei NOT DETECTED NOT DETECTED Final   Candida parapsilosis NOT DETECTED NOT DETECTED Final   Candida tropicalis NOT DETECTED NOT DETECTED Final   Cryptococcus neoformans/gattii NOT DETECTED NOT DETECTED Final    Comment: Performed at [facility], [address], [place], [place] [address]  Expectorated Sputum Assessment w Gram Stain, Rflx to Resp Cult     Status: None   Collection Time: 08/13/22  2:15 PM   Specimen: Expectorated Sputum  Result Value Ref Range Status   Specimen Description EXPECTORATED SPUTUM  Final   Special Requests NONE  Final   Sputum evaluation   Final    THIS SPECIMEN IS ACCEPTABLE FOR SPUTUM CULTURE Performed at [facility], [place] [address], [place], [place] [address]    Report Status 08/14/2022 FINAL  Final  Culture, Respiratory w Gram Stain     Status: None (Preliminary result)   Collection Time: 08/13/22  2:15 PM  Result Value Ref Range Status   Specimen Description EXPECTORATED SPUTUM  Final   Special Requests NONE Reflexed from [id number]  Final   Gram Stain   Final    MODERATE WBC PRESENT, PREDOMINANTLY PMN RARE GRAM POSITIVE COCCI IN PAIRS Performed at [facility], [place] [address] [place], [place] [address]    Culture PENDING  Incomplete   Report Status PENDING  Incomplete         Radiology Studies: DG Chest 2 View  Result Date: 08/12/2022 CLINICAL DATA:  Cough, shortness of breath, and fever. EXAM: CHEST - 2 VIEW COMPARISON:  PET-05/14/2022 FINDINGS: The cardiac silhouette is upper limits of normal in size.  There is elevation of the right hemidiaphragm. Patchy airspace opacities are present in the right lung base. The left lung is clear. No pleural effusion or pneumothorax is identified. No acute osseous abnormality is seen. IMPRESSION: Right basilar airspace disease compatible with pneumonia. Followup PA and lateral chest radiographs are recommended in 3-4 weeks following trial of antibiotic therapy to ensure resolution and exclude underlying malignancy. Electronically Signed   By: Logan Bores M.D.   On: 08/12/2022 14:23        Scheduled Meds:  amLODipine  10 mg Oral QPM   atenolol  100 mg Oral Daily   docusate sodium  100 mg Oral BID   enoxaparin (LOVENOX) injection  40 mg Subcutaneous Q24H   insulin aspart  0-15 Units Subcutaneous TID WC   insulin aspart  0-5 Units Subcutaneous QHS   insulin aspart  5 Units Subcutaneous TID WC   insulin glargine-yfgn  20 Units Subcutaneous BID   rosuvastatin  10 mg Oral QPM   Continuous Infusions:  sodium chloride 75 mL/hr at 08/14/22 0610   [START ON 08/15/2022] cefTRIAXone (ROCEPHIN)  IV       LOS: 1 day    Time spent: 35 minutes.     Elmarie Shiley, MD [facility]   If 7PM-7AM, please contact night-coverage [email or web address]  08/14/2022, 2:12 PM

## 2022-08-14 NOTE — Progress Notes (Signed)
  Echocardiogram 2D Echocardiogram has been performed.  Ronny Flurry 08/14/2022, 3:34 PM

## 2022-08-14 NOTE — Inpatient Diabetes Management (Signed)
Inpatient Diabetes Program Recommendations  AACE/ADA: New Consensus Statement on Inpatient Glycemic Control (2015)  Target Ranges:  Prepandial:   less than 140 mg/dL      Peak postprandial:   less than 180 mg/dL (1-2 hours)      Critically ill patients:  140 - 180 mg/dL   Lab Results  Component Value Date   GLUCAP 355 (H) 08/14/2022   HGBA1C 11.3 (H) 08/13/2022    Latest Reference Range & Units 08/13/22 22:38 08/14/22 06:00 08/14/22 08:54 08/14/22 12:16  Glucose-Capillary 70 - 99 mg/dL 431 (H) 294 (H) 368 (H) 355 (H)  (H): Data is abnormally high Review of Glycemic Control  Diabetes history: type 2 Outpatient Diabetes medications: Tresiba 90 units daily, Metformin 1000 mg BID, Glucotrol 20 mg daily Current orders for Inpatient glycemic control: Semglee 20 units daily, Novolog MODERATE correction scale TID & HS scale, Novolog 4 units TID with meals  Inpatient Diabetes Program Recommendations:   Noted that blood sugars have been greater than 300 mg/dl.   Recommend increasing Semglee to 20 units BID if blood sugars continue to be elevated. Titrate dosages as needed.  Harvel Ricks RN BSN CDE Diabetes Coordinator Pager: [phone]  8am-5pm

## 2022-08-14 NOTE — Progress Notes (Signed)
Patient walked one lap without oxygen,oxygen sat at 93% drop to 92% and went up to  94%,no complain of shortness of breath,will continue to monitor.

## 2022-08-15 DIAGNOSIS — J189 Pneumonia, unspecified organism: Secondary | ICD-10-CM | POA: Diagnosis not present

## 2022-08-15 LAB — CBC
HCT: 41.3 % (ref 39.0–52.0)
Hemoglobin: 13.9 g/dL (ref 13.0–17.0)
MCH: 28.1 pg (ref 26.0–34.0)
MCHC: 33.7 g/dL (ref 30.0–36.0)
MCV: 83.6 fL (ref 80.0–100.0)
Platelets: 390 10*3/uL (ref 150–400)
RBC: 4.94 MIL/uL (ref 4.22–5.81)
RDW: 13.2 % (ref 11.5–15.5)
WBC: 12.5 10*3/uL — ABNORMAL HIGH (ref 4.0–10.5)
nRBC: 0 % (ref 0.0–0.2)

## 2022-08-15 LAB — GLUCOSE, CAPILLARY
Glucose-Capillary: 193 mg/dL — ABNORMAL HIGH (ref 70–99)
Glucose-Capillary: 286 mg/dL — ABNORMAL HIGH (ref 70–99)
Glucose-Capillary: 173 mg/dL — ABNORMAL HIGH (ref 70–99)
Glucose-Capillary: 152 mg/dL — ABNORMAL HIGH (ref 70–99)

## 2022-08-15 LAB — CULTURE, BLOOD (ROUTINE X 2): Special Requests: ADEQUATE

## 2022-08-15 LAB — BASIC METABOLIC PANEL
Anion gap: 8 (ref 5–15)
BUN: 32 mg/dL — ABNORMAL HIGH (ref 6–20)
CO2: 25 mmol/L (ref 22–32)
Calcium: 7.6 mg/dL — ABNORMAL LOW (ref 8.9–10.3)
Chloride: 108 mmol/L (ref 98–111)
Creatinine, Ser: 1.31 mg/dL — ABNORMAL HIGH (ref 0.61–1.24)
GFR, Estimated: >60 mL/min (ref >60)
Glucose, Bld: 208 mg/dL — ABNORMAL HIGH (ref 70–99)
Potassium: 3.3 mmol/L — ABNORMAL LOW (ref 3.5–5.1)
Sodium: 141 mmol/L (ref 135–145)

## 2022-08-15 MED ORDER — MAGNESIUM OXIDE -MG SUPPLEMENT 400 (240 MG) MG PO TABS
200.0000 mg | ORAL_TABLET | Freq: Two times a day (BID) | ORAL | Status: DC
  Administered 2022-08-15 – 2022-08-16 (×2): 200 mg via ORAL
  Filled 2022-08-15 (×2): qty 1

## 2022-08-15 MED ORDER — POTASSIUM CHLORIDE CRYS ER 20 MEQ PO TBCR
40.0000 meq | EXTENDED_RELEASE_TABLET | ORAL | Status: AC
  Administered 2022-08-15 (×2): 40 meq via ORAL
  Filled 2022-08-15 (×2): qty 2

## 2022-08-15 MED ORDER — INSULIN GLARGINE-YFGN 100 UNIT/ML ~~LOC~~ SOLN
30.0000 [IU] | Freq: Two times a day (BID) | SUBCUTANEOUS | Status: DC
  Administered 2022-08-15 – 2022-08-16 (×3): 30 [IU] via SUBCUTANEOUS
  Filled 2022-08-15 (×4): qty 0.3

## 2022-08-15 NOTE — Progress Notes (Signed)
PROGRESS NOTE    Marquelle Musgrave  HYQ:[id number] DOB: 19-Jun-1961 DOA: 08/12/2022 PCP: [name], MD   Brief Narrative: 61 year old with past medical history significant for diabetes, hypertension, hyperlipidemia, prostate cancer presents with shortness of breath.  He reports sick contact at home his wife and son were ill, last week.  Wife tested positive for influenza.  He also completed course of Tamiflu.  Over the weekend he started to have worsening cough, soreness across his chest and back, congestion.  Nonproductive cough.  He developed a fever.  Admitted with right lower lobe pneumonia and found to have strep pneumo bacteremia.   Assessment & Plan:   Principal Problem:   Right lower lobe pneumonia Active Problems:   Malignant neoplasm of prostate ([place])   Acute kidney injury superimposed on chronic kidney disease ([place])   Solitary kidney, acquired   Essential hypertension   Diabetes mellitus type 2 in obese ([facility])   Hypokalemia   Dyslipidemia   Nonspecific abnormal electrocardiogram (ECG) (EKG)   Bacteremia due to Streptococcus pneumoniae  1-Right lower lobe pneumonia Sepsis secondary to pneumonia.  Present on admission Patient presents with fever, tachypnea, leukocytosis, source of infection PNA, Bacteremia.  Pro-calcitonin elevated.  Sputum; rare gram positive cocci WBC 19---10--12 -Continue with IV ceftriaxone  He will need 7-10 days treatment.   AKI on stage IIIa CKD, solitary kidney AKI in the setting of dehydration, infection, while taking ACE. Prior creatinine; 1.1--1.2 Creatinine on admission peak 1.7 Improving with IV fluids, down to 1.3 NSL  Hypokalemia: Replete orally times 2 doses.   Hypomagnesemia: Replaced.   Diabetes type 2 with hyperglycemia uncontrolled Continue with semglee, change to BID,  sliding scale insulin Start meal coverage 5 units. SSI Holding glipizide, metformin while in patient.   Hyperlipidemia: Continue with  rosuvastatin  Prostate cancer: Stage Ic adenocarcinoma planning for CT radiation  Sinus Arrhythmia with PAC.  A fib was ruled out.  EKG with concern of A-fib: cardiology reviewed EKG and patient had sinus arrhythmia with PAC>  Repeated EKG sinus.  On coreg.  ECHO> Normal EF, diastolic Dysfunction grade 2.         Estimated body mass index is 33.52 kg/m as calculated from the following:   Height as of this encounter: '5\' 9"'$  (1.753 m).   Weight as of 07/23/22: 103 kg.   DVT prophylaxis: Lovenox Code Status: Full code Family Communication: Care discussed  with patient.  Disposition Plan:  Status is: Inpatient Remains inpatient appropriate because: management of PNA, bacteremia.     Consultants:  Cardiology   Procedures:  ECHO  Antimicrobials:    Subjective: He has been ambulating on the hall. He is breathing better. Report cough is now more productive.   Objective: Vitals:   08/14/22 1655 08/14/22 1930 08/15/22 0622 08/15/22 0748  BP: (!) 108/59 131/75 139/87 124/68  Pulse: (!) 58 62 67 64  Resp: '17 18 18 18  '$ Temp: (!) 97.5 F (36.4 C) 97.9 F (36.6 C) 97.9 F (36.6 C) 98 F (36.7 C)  TempSrc: Oral Oral Oral Oral  SpO2: 95% 95% 95% 97%  Height:        Intake/Output Summary (Last 24 hours) at 08/15/2022 1503 Last data filed at 08/15/2022 1000 Gross per 24 hour  Intake 3483.65 ml  Output 0 ml  Net 3483.65 ml    There were no vitals filed for this visit.  Examination:  General exam: NAD  Respiratory system: CTA Cardiovascular system: S,1 S 2 RRR Gastrointestinal system: BS present,  soft, nt Central nervous system: non focal.  Extremities; no edema    Data Reviewed: I have personally reviewed following labs and imaging studies  CBC: Recent Labs  Lab 08/13/22 0215 08/14/22 0656 08/15/22 0816  WBC 19.0* 10.3 12.5*  NEUTROABS 15.7*  --   --   HGB 14.7 13.1 13.9  HCT 44.3 38.9* 41.3  MCV 83.7 82.9 83.6  PLT 345 295 390    Basic  Metabolic Panel: Recent Labs  Lab 08/13/22 0215 08/13/22 0736 08/14/22 0656 08/15/22 0816  NA 134*  --  134* 141  K 2.7*  --  2.9* 3.3*  CL 96*  --  100 108  CO2 24  --  24 25  GLUCOSE 141*  --  310* 208*  BUN 32*  --  41* 32*  CREATININE 1.76*  --  1.59* 1.31*  CALCIUM 8.3*  --  7.1* 7.6*  MG  --  1.3* 1.6*  --     GFR: CrCl cannot be calculated (Unknown ideal weight.). Liver Function Tests: Recent Labs  Lab 08/13/22 0215  AST 29  ALT 18  ALKPHOS 56  BILITOT 1.2  PROT 7.0  ALBUMIN 2.8*    No results for input(s): "LIPASE", "AMYLASE" in the last 168 hours. No results for input(s): "AMMONIA" in the last 168 hours. Coagulation Profile: No results for input(s): "INR", "PROTIME" in the last 168 hours. Cardiac Enzymes: No results for input(s): "CKTOTAL", "CKMB", "CKMBINDEX", "TROPONINI" in the last 168 hours. BNP (last 3 results) No results for input(s): "PROBNP" in the last 8760 hours. HbA1C: Recent Labs    08/13/22 1613  HGBA1C 11.3*    CBG: Recent Labs  Lab 08/14/22 1216 08/14/22 1651 08/14/22 2123 08/15/22 0755 08/15/22 1133  GLUCAP 355* 328* 352* 193* 286*    Lipid Profile: No results for input(s): "CHOL", "HDL", "LDLCALC", "TRIG", "CHOLHDL", "LDLDIRECT" in the last 72 hours. Thyroid Function Tests: No results for input(s): "TSH", "T4TOTAL", "FREET4", "T3FREE", "THYROIDAB" in the last 72 hours. Anemia Panel: No results for input(s): "VITAMINB12", "FOLATE", "FERRITIN", "TIBC", "IRON", "RETICCTPCT" in the last 72 hours. Sepsis Labs: Recent Labs  Lab 08/13/22 0215 08/13/22 1613  PROCALCITON  --  4.81  LATICACIDVEN 1.3  --      Recent Results (from the past 240 hour(s))  Resp panel by RT-PCR (RSV, Flu A&B, Covid) Anterior Nasal Swab     Status: None   Collection Time: 08/12/22  1:37 PM   Specimen: Anterior Nasal Swab  Result Value Ref Range Status   SARS Coronavirus 2 by RT PCR NEGATIVE NEGATIVE Final    Comment: (NOTE) SARS-CoV-2 target  nucleic acids are NOT DETECTED.  The SARS-CoV-2 RNA is generally detectable in upper respiratory specimens during the acute phase of infection. The lowest concentration of SARS-CoV-2 viral copies this assay can detect is 138 copies/mL. A negative result does not preclude SARS-Cov-2 infection and should not be used as the sole basis for treatment or other patient management decisions. A negative result may occur with  improper specimen collection/handling, submission of specimen other than nasopharyngeal swab, presence of viral mutation(s) within the areas targeted by this assay, and inadequate number of viral copies(<138 copies/mL). A negative result must be combined with clinical observations, patient history, and epidemiological information. The expected result is Negative.  Fact Sheet for Patients:  EntrepreneurPulse.com.au  Fact Sheet for Healthcare Providers:  IncredibleEmployment.be  This test is no t yet approved or cleared by the [place] FDA and  has been authorized for detection and/or  diagnosis of SARS-CoV-2 by FDA under an Emergency Use Authorization (EUA). This EUA will remain  in effect (meaning this test can be used) for the duration of the COVID-19 declaration under Section 564(b)(1) of the Act, 21 U.S.C.section 360bbb-3(b)(1), unless the authorization is terminated  or revoked sooner.       Influenza A by PCR NEGATIVE NEGATIVE Final   Influenza B by PCR NEGATIVE NEGATIVE Final    Comment: (NOTE) The Xpert Xpress SARS-CoV-2/FLU/RSV plus assay is intended as an aid in the diagnosis of influenza from Nasopharyngeal swab specimens and should not be used as a sole basis for treatment. Nasal washings and aspirates are unacceptable for Xpert Xpress SARS-CoV-2/FLU/RSV testing.  Fact Sheet for Patients: EntrepreneurPulse.com.au  Fact Sheet for Healthcare  Providers: IncredibleEmployment.be  This test is not yet approved or cleared by the [place] FDA and has been authorized for detection and/or diagnosis of SARS-CoV-2 by FDA under an Emergency Use Authorization (EUA). This EUA will remain in effect (meaning this test can be used) for the duration of the COVID-19 declaration under Section 564(b)(1) of the Act, 21 U.S.C. section 360bbb-3(b)(1), unless the authorization is terminated or revoked.     Resp Syncytial Virus by PCR NEGATIVE NEGATIVE Final    Comment: (NOTE) Fact Sheet for Patients: EntrepreneurPulse.com.au  Fact Sheet for Healthcare Providers: IncredibleEmployment.be  This test is not yet approved or cleared by the [place] FDA and has been authorized for detection and/or diagnosis of SARS-CoV-2 by FDA under an Emergency Use Authorization (EUA). This EUA will remain in effect (meaning this test can be used) for the duration of the COVID-19 declaration under Section 564(b)(1) of the Act, 21 U.S.C. section 360bbb-3(b)(1), unless the authorization is terminated or revoked.  Performed at [facility], [place] [address] [place], [place] [address]   Culture, blood (routine x 2)     Status: Abnormal   Collection Time: 08/12/22  9:33 PM   Specimen: BLOOD  Result Value Ref Range Status   Specimen Description BLOOD SITE NOT SPECIFIED  Final   Special Requests   Final    BOTTLES DRAWN AEROBIC AND ANAEROBIC Blood Culture results may not be optimal due to an excessive volume of blood received in culture bottles   Culture  Setup Time   Final    GRAM POSITIVE COCCI IN PAIRS IN BOTH AEROBIC AND ANAEROBIC BOTTLES CRITICAL RESULT CALLED TO, READ BACK BY AND VERIFIED WITH: PHARMD Ejay LEDFORD ON 08/13/22 @ 5784 BY DRT    Culture (A)  Final    STREPTOCOCCUS PNEUMONIAE SUSCEPTIBILITIES PERFORMED ON PREVIOUS CULTURE WITHIN THE LAST 5 DAYS. Performed at [facility], [place] [address] [place], [place] [address]    Report Status 08/15/2022 FINAL  Final  Culture, blood (routine x 2)     Status: Abnormal   Collection Time: 08/13/22  2:15 AM   Specimen: BLOOD  Result Value Ref Range Status   Specimen Description BLOOD SITE NOT SPECIFIED  Final   Special Requests   Final    BOTTLES DRAWN AEROBIC AND ANAEROBIC Blood Culture adequate volume   Culture  Setup Time   Final    GRAM POSITIVE COCCI IN PAIRS IN BOTH AEROBIC AND ANAEROBIC BOTTLES CRITICAL RESULT CALLED TO, READ BACK BY AND VERIFIED WITH: PHARMD J. [facility] [id number] '@1732'$  FH Performed at [place] [address] [place], [place] [address]    Culture STREPTOCOCCUS PNEUMONIAE (A)  Final   Report Status 08/15/2022 FINAL  Final  Organism ID, Bacteria STREPTOCOCCUS PNEUMONIAE  Final      Susceptibility   Streptococcus pneumoniae - MIC*    ERYTHROMYCIN <=0.12 SENSITIVE Sensitive     LEVOFLOXACIN 0.5 SENSITIVE Sensitive     VANCOMYCIN <=0.12 SENSITIVE Sensitive     PENICILLIN (meningitis) <=0.06 SENSITIVE Sensitive     PENO - penicillin <=0.06      PENICILLIN (oral) <=0.06 SENSITIVE Sensitive     CEFTRIAXONE (meningitis) <=0.12 SENSITIVE Sensitive     * STREPTOCOCCUS PNEUMONIAE  Blood Culture ID Panel (Reflexed)     Status: Abnormal   Collection Time: 08/13/22  2:15 AM  Result Value Ref Range Status   Enterococcus faecalis NOT DETECTED NOT DETECTED Final   Enterococcus Faecium NOT DETECTED NOT DETECTED Final   Listeria monocytogenes NOT DETECTED NOT DETECTED Final   Staphylococcus species NOT DETECTED NOT DETECTED Final   Staphylococcus aureus (BCID) NOT DETECTED NOT DETECTED Final   Staphylococcus epidermidis NOT DETECTED NOT DETECTED Final   Staphylococcus lugdunensis NOT DETECTED NOT DETECTED Final   Streptococcus species DETECTED (A) NOT DETECTED Final    Comment: CRITICAL RESULT CALLED TO, READ BACK BY AND VERIFIED WITH: PHARMD J. [facility] [id number] '@1732'$  FH    Streptococcus  agalactiae NOT DETECTED NOT DETECTED Final   Streptococcus pneumoniae DETECTED (A) NOT DETECTED Final    Comment: CRITICAL RESULT CALLED TO, READ BACK BY AND VERIFIED WITH: PHARMD J. [facility] [id number] '@1732'$  FH    Streptococcus pyogenes NOT DETECTED NOT DETECTED Final   A.calcoaceticus-baumannii NOT DETECTED NOT DETECTED Final   Bacteroides fragilis NOT DETECTED NOT DETECTED Final   Enterobacterales NOT DETECTED NOT DETECTED Final   Enterobacter cloacae complex NOT DETECTED NOT DETECTED Final   Escherichia coli NOT DETECTED NOT DETECTED Final   Klebsiella aerogenes NOT DETECTED NOT DETECTED Final   Klebsiella oxytoca NOT DETECTED NOT DETECTED Final   Klebsiella pneumoniae NOT DETECTED NOT DETECTED Final   Proteus species NOT DETECTED NOT DETECTED Final   Salmonella species NOT DETECTED NOT DETECTED Final   Serratia marcescens NOT DETECTED NOT DETECTED Final   Haemophilus influenzae NOT DETECTED NOT DETECTED Final   Neisseria meningitidis NOT DETECTED NOT DETECTED Final   Pseudomonas aeruginosa NOT DETECTED NOT DETECTED Final   Stenotrophomonas maltophilia NOT DETECTED NOT DETECTED Final   Candida albicans NOT DETECTED NOT DETECTED Final   Candida auris NOT DETECTED NOT DETECTED Final   Candida glabrata NOT DETECTED NOT DETECTED Final   Candida krusei NOT DETECTED NOT DETECTED Final   Candida parapsilosis NOT DETECTED NOT DETECTED Final   Candida tropicalis NOT DETECTED NOT DETECTED Final   Cryptococcus neoformans/gattii NOT DETECTED NOT DETECTED Final    Comment: Performed at [facility], [address] [place], [place] [address]  Expectorated Sputum Assessment w Gram Stain, Rflx to Resp Cult     Status: None   Collection Time: 08/13/22  2:15 PM   Specimen: Expectorated Sputum  Result Value Ref Range Status   Specimen Description EXPECTORATED SPUTUM  Final   Special Requests NONE  Final   Sputum evaluation   Final    THIS SPECIMEN IS ACCEPTABLE FOR SPUTUM CULTURE Performed at  [facility], [place] [address], [place], [place] [address]    Report Status 08/14/2022 FINAL  Final  Culture, Respiratory w Gram Stain     Status: None (Preliminary result)   Collection Time: 08/13/22  2:15 PM  Result Value Ref Range Status   Specimen Description EXPECTORATED SPUTUM  Final   Special Requests NONE  Reflexed from [id number]  Final   Gram Stain   Final    MODERATE WBC PRESENT, PREDOMINANTLY PMN RARE GRAM POSITIVE COCCI IN PAIRS Performed at [facility], [place] [address] [place], [place] [address]    Culture PENDING  Incomplete   Report Status PENDING  Incomplete         Radiology Studies: ECHOCARDIOGRAM COMPLETE  Result Date: 08/14/2022    ECHOCARDIOGRAM REPORT   Patient Name:   ERION WEIGHTMAN Date of Exam: 08/14/2022 Medical Rec #:  [id number]   Height:       69.0 in Accession #:    [id number]  Weight:       227.0 lb Date of Birth:  May 30, 1961    BSA:          2.180 m Patient Age:    89 years    BP:           109/61 mmHg Patient Gender: M           HR:           62 bpm. Exam Location:  Inpatient Procedure: 2D Echo, Color Doppler, Cardiac Doppler and Intracardiac            Opacification Agent Indications:    Atrial Fibrillation I48.91  History:        Patient has no prior history of Echocardiogram examinations.                 Risk Factors:Hypertension, Diabetes and Dyslipidemia.  Sonographer:    Ronny Flurry Referring Phys: [id number] [place] Legore A Ezelle Surprenant IMPRESSIONS  1. Left ventricular ejection fraction, by estimation, is 60 to 65%. The left ventricle has normal function. The left ventricle has no regional wall motion abnormalities. There is mild concentric left ventricular hypertrophy. Left ventricular diastolic parameters are consistent with Grade II diastolic dysfunction (pseudonormalization). Elevated left ventricular end-diastolic pressure.  2. Right ventricular systolic function is normal. The right ventricular size is normal. There is normal pulmonary artery systolic  pressure.  3. Left atrial size was mildly dilated.  4. The mitral valve is normal in structure. Mild mitral valve regurgitation. No evidence of mitral stenosis.  5. The aortic valve is tricuspid. Aortic valve regurgitation is not visualized. No aortic stenosis is present.  6. Aortic dilatation noted. There is mild dilatation of the aortic root.  7. The inferior vena cava is dilated in size with <50% respiratory variability, suggesting right atrial pressure of 15 mmHg. FINDINGS  Left Ventricle: Left ventricular ejection fraction, by estimation, is 60 to 65%. The left ventricle has normal function. The left ventricle has no regional wall motion abnormalities. Definity contrast agent was given IV to delineate the left ventricular  endocardial borders. The left ventricular internal cavity size was normal in size. There is mild concentric left ventricular hypertrophy. Left ventricular diastolic parameters are consistent with Grade II diastolic dysfunction (pseudonormalization). Elevated left ventricular end-diastolic pressure. Right Ventricle: The right ventricular size is normal. No increase in right ventricular wall thickness. Right ventricular systolic function is normal. There is normal pulmonary artery systolic pressure. The tricuspid regurgitant velocity is 1.12 m/s, and  with an assumed right atrial pressure of 15 mmHg, the estimated right ventricular systolic pressure is 69.6 mmHg. Left Atrium: Left atrial size was mildly dilated. Right Atrium: Right atrial size was normal in size. Pericardium: There is no evidence of pericardial effusion. Mitral Valve: The mitral valve is normal in structure. Mild mitral valve regurgitation. No evidence of mitral valve stenosis. Tricuspid  Valve: The tricuspid valve is normal in structure. Tricuspid valve regurgitation is trivial. No evidence of tricuspid stenosis. Aortic Valve: The aortic valve is tricuspid. Aortic valve regurgitation is not visualized. No aortic stenosis is  present. Aortic valve mean gradient measures 3.0 mmHg. Aortic valve peak gradient measures 6.2 mmHg. Aortic valve area, by VTI measures 3.16 cm. Pulmonic Valve: The pulmonic valve was normal in structure. Pulmonic valve regurgitation is not visualized. No evidence of pulmonic stenosis. Aorta: Aortic dilatation noted. There is mild dilatation of the aortic root. Venous: The inferior vena cava is dilated in size with less than 50% respiratory variability, suggesting right atrial pressure of 15 mmHg. IAS/Shunts: No atrial level shunt detected by color flow Doppler.  LEFT VENTRICLE PLAX 2D LVIDd:         5.30 cm   Diastology LVIDs:         3.90 cm   LV e' medial:    5.33 cm/s LV PW:         1.20 cm   LV E/e' medial:  20.8 LV IVS:        1.20 cm   LV e' lateral:   9.14 cm/s LVOT diam:     2.30 cm   LV E/e' lateral: 12.1 LV SV:         90 LV SV Index:   41 LVOT Area:     4.15 cm  RIGHT VENTRICLE             IVC RV S prime:     12.80 cm/s  IVC diam: 2.73 cm TAPSE (M-mode): 2.8 cm LEFT ATRIUM             Index        RIGHT ATRIUM           Index LA diam:        4.20 cm 1.93 cm/m   RA Area:     14.90 cm LA Vol (A2C):   54.7 ml 25.09 ml/m  RA Volume:   37.70 ml  17.29 ml/m LA Vol (A4C):   92.1 ml 42.24 ml/m LA Biplane Vol: 75.1 ml 34.45 ml/m  AORTIC VALVE AV Area (Vmax):    3.24 cm AV Area (Vmean):   3.11 cm AV Area (VTI):     3.16 cm AV Vmax:           125.00 cm/s AV Vmean:          86.300 cm/s AV VTI:            0.284 m AV Peak Grad:      6.2 mmHg AV Mean Grad:      3.0 mmHg LVOT Vmax:         97.50 cm/s LVOT Vmean:        64.567 cm/s LVOT VTI:          0.216 m LVOT/AV VTI ratio: 0.76  AORTA Ao Root diam: 3.70 cm Ao Asc diam:  3.50 cm MITRAL VALVE                TRICUSPID VALVE MV Area (PHT): 4.49 cm     TR Peak grad:   5.0 mmHg MV Decel Time: 169 msec     TR Vmax:        112.00 cm/s MV E velocity: 111.00 cm/s MV A velocity: 50.10 cm/s   SHUNTS MV E/A ratio:  2.22         Systemic VTI:  0.22 m  Systemic Diam: 2.30 cm Skeet Latch MD Electronically signed by Skeet Latch MD Signature Date/Time: 08/14/2022/7:04:27 PM    Final         Scheduled Meds:  amLODipine  10 mg Oral QPM   atenolol  100 mg Oral Daily   docusate sodium  100 mg Oral BID   enoxaparin (LOVENOX) injection  40 mg Subcutaneous Q24H   guaiFENesin  1,200 mg Oral BID   insulin aspart  0-15 Units Subcutaneous TID WC   insulin aspart  0-5 Units Subcutaneous QHS   insulin aspart  5 Units Subcutaneous TID WC   insulin glargine-yfgn  30 Units Subcutaneous BID   potassium chloride  40 mEq Oral Q4H   rosuvastatin  10 mg Oral QPM   Continuous Infusions:  cefTRIAXone (ROCEPHIN)  IV 2 g (08/15/22 0811)     LOS: 2 days    Time spent: 35 minutes.     Elmarie Shiley, MD [facility]   If 7PM-7AM, please contact night-coverage [email or web address]  08/15/2022, 3:03 PM

## 2022-08-16 DIAGNOSIS — J9601 Acute respiratory failure with hypoxia: Secondary | ICD-10-CM | POA: Diagnosis not present

## 2022-08-16 LAB — GLUCOSE, CAPILLARY: Glucose-Capillary: 188 mg/dL — ABNORMAL HIGH (ref 70–99)

## 2022-08-16 LAB — MAGNESIUM: Magnesium: 1.8 mg/dL (ref 1.7–2.4)

## 2022-08-16 LAB — CBC
HCT: 39.8 % (ref 39.0–52.0)
Hemoglobin: 13.4 g/dL (ref 13.0–17.0)
MCH: 28.5 pg (ref 26.0–34.0)
MCHC: 33.7 g/dL (ref 30.0–36.0)
MCV: 84.5 fL (ref 80.0–100.0)
Platelets: 313 10*3/uL (ref 150–400)
RBC: 4.71 MIL/uL (ref 4.22–5.81)
RDW: 13.1 % (ref 11.5–15.5)
WBC: 10 10*3/uL (ref 4.0–10.5)
nRBC: 0 % (ref 0.0–0.2)

## 2022-08-16 LAB — CULTURE, RESPIRATORY W GRAM STAIN: Culture: NORMAL

## 2022-08-16 LAB — BASIC METABOLIC PANEL
Anion gap: 10 (ref 5–15)
BUN: 22 mg/dL — ABNORMAL HIGH (ref 6–20)
CO2: 28 mmol/L (ref 22–32)
Calcium: 7.9 mg/dL — ABNORMAL LOW (ref 8.9–10.3)
Chloride: 103 mmol/L (ref 98–111)
Creatinine, Ser: 1.25 mg/dL — ABNORMAL HIGH (ref 0.61–1.24)
GFR, Estimated: >60 mL/min (ref >60)
Glucose, Bld: 134 mg/dL — ABNORMAL HIGH (ref 70–99)
Potassium: 3.2 mmol/L — ABNORMAL LOW (ref 3.5–5.1)
Sodium: 141 mmol/L (ref 135–145)

## 2022-08-16 MED ORDER — CEFDINIR 300 MG PO CAPS: 300.0000 mg | ORAL_CAPSULE | Freq: Two times a day (BID) | ORAL | 0 refills | Status: AC

## 2022-08-16 MED ORDER — MAGNESIUM OXIDE -MG SUPPLEMENT 400 (240 MG) MG PO TABS: 200.0000 mg | ORAL_TABLET | Freq: Two times a day (BID) | ORAL | 0 refills | Status: DC

## 2022-08-16 MED ORDER — GUAIFENESIN ER 600 MG PO TB12: 1200.0000 mg | ORAL_TABLET | Freq: Two times a day (BID) | ORAL | 0 refills | Status: DC

## 2022-08-16 MED ORDER — POTASSIUM CHLORIDE CRYS ER 20 MEQ PO TBCR: 40.0000 meq | EXTENDED_RELEASE_TABLET | Freq: Every day | ORAL | 0 refills | Status: DC

## 2022-08-16 MED ORDER — POTASSIUM CHLORIDE CRYS ER 20 MEQ PO TBCR: 20.0000 meq | EXTENDED_RELEASE_TABLET | Freq: Every day | ORAL | 0 refills | Status: DC

## 2022-08-16 MED ORDER — POTASSIUM CHLORIDE CRYS ER 20 MEQ PO TBCR: 40.0000 meq | EXTENDED_RELEASE_TABLET | ORAL | Status: DC

## 2022-08-16 MED ORDER — POTASSIUM CHLORIDE CRYS ER 20 MEQ PO TBCR
40.0000 meq | EXTENDED_RELEASE_TABLET | ORAL | Status: DC
  Administered 2022-08-16: 40 meq via ORAL
  Filled 2022-08-16: qty 2

## 2022-08-16 NOTE — Discharge Summary (Signed)
Physician Discharge Summary   Patient: Travis Deleon MRN: [id number] DOB: 21-May-1961  Admit date:     08/12/2022  Discharge date: 08/16/22  Discharge Physician: Elmarie Shiley   PCP: [name], MD   Recommendations at discharge:    Need B-met to follow potassium level and renal function.  Follow up resolution PNA Controlled BP.   Discharge Diagnoses: Principal Problem:   Right lower lobe pneumonia Active Problems:   Malignant neoplasm of prostate ([place])   Acute kidney injury superimposed on chronic kidney disease ([place])   Solitary kidney, acquired   Essential hypertension   Diabetes mellitus type 2 in obese ([facility])   Hypokalemia   Dyslipidemia   Nonspecific abnormal electrocardiogram (ECG) (EKG)   Bacteremia due to Streptococcus pneumoniae  Resolved Problems:   * No resolved hospital problems. *  Hospital Course: 61 year old with past medical history significant for diabetes, hypertension, hyperlipidemia, prostate cancer presents with shortness of breath.  He reports sick contact at home his wife and son were ill, last week.  Wife tested positive for influenza.  He also completed course of Tamiflu.  Over the weekend he started to have worsening cough, soreness across his chest and back, congestion.  Nonproductive cough.  He developed a fever.   Admitted with right lower lobe pneumonia and found to have strep pneumo bacteremia.    Assessment and Plan: 1-Right lower lobe pneumonia Sepsis secondary to pneumonia.  Present on admission Patient presents with fever, tachypnea, leukocytosis, source of infection PNA, Bacteremia.  Pro-calcitonin elevated.  Sputum; rare gram positive cocci WBC 19---10--12--10 -Continue with IV ceftriaxone received 4 days in the hospital.  He will need 10 days treatment. Discharge on cefdinir for 6 days.    AKI on stage IIIa CKD, solitary kidney AKI in the setting of dehydration, infection, while taking ACE. Prior creatinine;  1.1--1.2 Creatinine on admission peak 1.7 Improving with IV fluids, down to 1.3 NSL Hold HCTZ at discharge.  Resume eplerenone. Needs Bmet within 1 week.   Hypokalemia: replaced.  Discharge on 20 meq po daily for two days.  Hold HCTZ at discharge.     Hypomagnesemia: Replaced.    Diabetes type 2 with hyperglycemia uncontrolled Resume home regimen,  Resume  glipizide, metformin while in patient.    Hyperlipidemia: Continue with rosuvastatin   Prostate cancer: Stage Ic adenocarcinoma planning for CT radiation   Sinus Arrhythmia with PAC.  A fib was ruled out.  EKG with concern of A-fib: cardiology reviewed EKG and patient had sinus arrhythmia with PAC>  Repeated EKG sinus.  On coreg.  ECHO> Normal EF, diastolic Dysfunction grade 2. needs BP controlled. FU with PCP            Consultants: None Procedures performed: ECHO Disposition: Home Diet recommendation:  Discharge Diet Orders (From admission, onward)     Start     Ordered   08/16/22 0000  Diet Carb Modified        08/16/22 0903           Carb modified diet DISCHARGE MEDICATION: Allergies as of 08/16/2022       Reactions   Aspirin Hives, Rash        Medication List     STOP taking these medications    hydrochlorothiazide 50 MG tablet Commonly known as: HYDRODIURIL   lisinopril 20 MG tablet Commonly known as: ZESTRIL       TAKE these medications    amLODipine 10 MG tablet Commonly known as: NORVASC Take 10 mg  by mouth every evening.   atenolol 100 MG tablet Commonly known as: TENORMIN Take 100 mg by mouth daily.   cefdinir 300 MG capsule Commonly known as: OMNICEF Take 1 capsule (300 mg total) by mouth 2 (two) times daily for 6 days.   dextromethorphan 30 MG/5ML liquid Commonly known as: DELSYM Take 30 mg by mouth 2 (two) times daily as needed for cough.   eplerenone 50 MG tablet Commonly known as: INSPRA Take 100 mg by mouth daily.   glipiZIDE 10 MG tablet Commonly  known as: GLUCOTROL Take 20 mg by mouth daily before breakfast.   guaiFENesin 600 MG 12 hr tablet Commonly known as: MUCINEX Take 2 tablets (1,200 mg total) by mouth 2 (two) times daily.   insulin degludec 200 UNIT/ML FlexTouch Pen Commonly known as: TRESIBA Inject 90 Units into the skin daily.   magnesium oxide 400 (240 Mg) MG tablet Commonly known as: MAG-OX Take 0.5 tablets (200 mg total) by mouth 2 (two) times daily.   metFORMIN 1000 MG tablet Commonly known as: GLUCOPHAGE Take 1,000 mg by mouth 2 (two) times daily with a meal.   ondansetron 4 MG disintegrating tablet Commonly known as: Zofran ODT Take 1 tablet (4 mg total) by mouth every 8 (eight) hours as needed for nausea or vomiting.   Ozempic (0.25 or 0.5 MG/DOSE) 2 MG/3ML Sopn Generic drug: Semaglutide(0.25 or 0.5MG/DOS) Inject 0.5 mg into the skin every _0  bpm. Exam Location:  Inpatient Procedure: 2D Echo, Color Doppler, Cardiac Doppler and Intracardiac            Opacification Agent Indications:    Atrial Fibrillation  I48.91  History:        Patient has no prior history of Echocardiogram examinations.                 Risk Factors:Hypertension, Diabetes and Dyslipidemia.  Sonographer:    Ronny Flurry Referring Phys: [id number] BELKYS A REGALADO IMPRESSIONS  1. Left ventricular ejection fraction, by estimation, is 60 to 65%. The left ventricle has normal function. The left ventricle has no regional wall motion abnormalities. There is mild concentric left ventricular hypertrophy. Left ventricular diastolic parameters are consistent with Grade II diastolic dysfunction (pseudonormalization). Elevated left ventricular end-diastolic pressure.  2. Right ventricular systolic function is normal. The right ventricular size is normal. There is normal pulmonary artery systolic pressure.  3. Left atrial size was mildly dilated.  4. The mitral valve is normal in structure. Mild mitral valve regurgitation. No evidence of mitral stenosis.  5. The aortic valve is tricuspid. Aortic valve regurgitation is not visualized. No aortic stenosis is present.  6. Aortic dilatation noted. There is mild dilatation of the aortic root.  7. The inferior vena cava is dilated in size with <50% respiratory variability, suggesting right atrial pressure of 15 mmHg. FINDINGS  Left Ventricle: Left ventricular ejection fraction, by estimation, is 60 to 65%. The left ventricle has normal function. The left ventricle has no regional wall motion abnormalities. Definity contrast agent was given IV to delineate the left ventricular  endocardial borders. The left ventricular internal cavity size was normal in size. There is mild concentric left ventricular hypertrophy. Left ventricular diastolic parameters are consistent with Grade II diastolic dysfunction (pseudonormalization). Elevated left ventricular end-diastolic pressure. Right Ventricle: The right ventricular size is normal. No increase in right ventricular wall thickness. Right ventricular systolic function is normal.  There is normal pulmonary artery systolic pressure. The tricuspid regurgitant velocity is 1.12 m/s, and  with an assumed right atrial pressure of 15 mmHg, the estimated right ventricular systolic pressure is 94.5 mmHg. Left Atrium: Left atrial size was mildly dilated. Right Atrium: Right atrial size was normal in size. Pericardium: There is no evidence of pericardial effusion. Mitral Valve: The mitral valve is normal in structure. Mild mitral valve regurgitation. No evidence of mitral valve stenosis. Tricuspid Valve: The tricuspid valve is normal in structure. Tricuspid valve regurgitation is trivial. No evidence of tricuspid stenosis. Aortic Valve: The aortic valve is tricuspid. Aortic valve regurgitation is not visualized. No aortic stenosis is present. Aortic valve mean gradient measures 3.0 mmHg. Aortic valve peak gradient measures 6.2 mmHg. Aortic valve area, by VTI measures 3.16 cm. Pulmonic Valve: The pulmonic valve was normal in structure. Pulmonic valve regurgitation is not visualized. No evidence of pulmonic stenosis. Aorta: Aortic dilatation noted. There is mild dilatation of the aortic root. Venous: The inferior vena cava is dilated in size with less than 50% respiratory variability, suggesting right atrial pressure of 15 mmHg. IAS/Shunts: No atrial level shunt detected by color flow Doppler.  LEFT VENTRICLE PLAX 2D LVIDd:         5.30 cm   Diastology LVIDs:         3.90 cm   LV e' medial:    5.33 cm/s LV PW:         1.20 cm   LV E/e' medial:  20.8 LV IVS:        1.20 cm   LV e' lateral:   9.14 cm/s LVOT diam:     2.30 cm   LV E/e' lateral: 12.1 LV SV:         90 LV SV Index:   41 LVOT Area:     4.15 cm  RIGHT VENTRICLE             IVC RV S prime:     12.80 cm/s  IVC diam: 2.73 cm TAPSE (M-mode): 2.8 cm LEFT ATRIUM             Index        RIGHT ATRIUM           Index LA diam:        4.20 cm 1.93 cm/m  RA Area:     14.90 cm LA Vol (A2C):   54.7 ml 25.09 ml/m  RA Volume:   37.70 ml  17.29 ml/m LA  Vol (A4C):   92.1 ml 42.24 ml/m LA Biplane Vol: 75.1 ml 34.45 ml/m  AORTIC VALVE AV Area (Vmax):    3.24 cm AV Area (Vmean):   3.11 cm AV Area (VTI):     3.16 cm AV Vmax:           125.00 cm/s AV Vmean:          86.300 cm/s AV VTI:            0.284 m AV Peak Grad:      6.2 mmHg AV Mean Grad:      3.0 mmHg LVOT Vmax:         97.50 cm/s LVOT Vmean:        64.567 cm/s LVOT VTI:          0.216 m LVOT/AV VTI ratio: 0.76  AORTA Ao Root diam: 3.70 cm Ao Asc diam:  3.50 cm MITRAL VALVE                TRICUSPID VALVE MV Area (PHT): 4.49 cm     TR Peak grad:   5.0 mmHg MV Decel Time: 169 msec     TR Vmax:        112.00 cm/s MV E velocity: 111.00 cm/s MV A velocity: 50.10 cm/s   SHUNTS MV E/A ratio:  2.22         Systemic VTI:  0.22 m                             Systemic Diam: 2.30 cm Skeet Latch MD Electronically signed by Skeet Latch MD Signature Date/Time: 08/14/2022/7:04:27 PM    Final    DG Chest 2 View  Result Date: 08/12/2022 CLINICAL DATA:  Cough, shortness of breath, and fever. EXAM: CHEST - 2 VIEW COMPARISON:  PET-05/14/2022 FINDINGS: The cardiac silhouette is upper limits of normal in size. There is elevation of the right hemidiaphragm. Patchy airspace opacities are present in the right lung base. The left lung is clear. No pleural effusion or pneumothorax is identified. No acute osseous abnormality is seen. IMPRESSION: Right basilar airspace disease compatible with pneumonia. Followup PA and lateral chest radiographs are recommended in 3-4 weeks following trial of antibiotic therapy to ensure resolution and exclude underlying malignancy. Electronically Signed   By: Logan Bores M.D.   On: 08/12/2022 14:23    Microbiology: Results for orders placed or performed during the hospital encounter of 08/12/22  Resp panel by RT-PCR (RSV, Flu A&B, Covid) Anterior Nasal Swab     Status: None   Collection Time: 08/12/22  1:37 PM   Specimen: Anterior Nasal Swab  Result Value Ref Range Status   SARS  Coronavirus 2 by RT PCR NEGATIVE NEGATIVE Final    Comment: (NOTE) SARS-CoV-2 target nucleic acids are NOT DETECTED.  The SARS-CoV-2 RNA is generally detectable in upper respiratory specimens during the acute phase of infection. The lowest concentration of SARS-CoV-2 viral copies this assay can detect is 138 copies/mL. A negative result does not preclude SARS-Cov-2 infection and should not be used as the sole basis for treatment or other patient management decisions. A negative result may occur with  improper specimen collection/handling, submission of specimen other than nasopharyngeal swab, presence of viral mutation(s) within the areas targeted by this  assay, and inadequate number of viral copies(<138 copies/mL). A negative result must be combined with clinical observations, patient history, and epidemiological information. The expected result is Negative.  Fact Sheet for Patients:  EntrepreneurPulse.com.au  Fact Sheet for Healthcare Providers:  IncredibleEmployment.be  This test is no t yet approved or cleared by the [place] FDA and  has been authorized for detection and/or diagnosis of SARS-CoV-2 by FDA under an Emergency Use Authorization (EUA). This EUA will remain  in effect (meaning this test can be used) for the duration of the COVID-19 declaration under Section 564(b)(1) of the Act, 21 U.S.C.section 360bbb-3(b)(1), unless the authorization is terminated  or revoked sooner.       Influenza A by PCR NEGATIVE NEGATIVE Final   Influenza B by PCR NEGATIVE NEGATIVE Final    Comment: (NOTE) The Xpert Xpress SARS-CoV-2/FLU/RSV plus assay is intended as an aid in the diagnosis of influenza from Nasopharyngeal swab specimens and should not be used as a sole basis for treatment. Nasal washings and aspirates are unacceptable for Xpert Xpress SARS-CoV-2/FLU/RSV testing.  Fact Sheet for  Patients: EntrepreneurPulse.com.au  Fact Sheet for Healthcare Providers: IncredibleEmployment.be  This test is not yet approved or cleared by the [place] FDA and has been authorized for detection and/or diagnosis of SARS-CoV-2 by FDA under an Emergency Use Authorization (EUA). This EUA will remain in effect (meaning this test can be used) for the duration of the COVID-19 declaration under Section 564(b)(1) of the Act, 21 U.S.C. section 360bbb-3(b)(1), unless the authorization is terminated or revoked.     Resp Syncytial Virus by PCR NEGATIVE NEGATIVE Final    Comment: (NOTE) Fact Sheet for Patients: EntrepreneurPulse.com.au  Fact Sheet for Healthcare Providers: IncredibleEmployment.be  This test is not yet approved or cleared by the [place] FDA and has been authorized for detection and/or diagnosis of SARS-CoV-2 by FDA under an Emergency Use Authorization (EUA). This EUA will remain in effect (meaning this test can be used) for the duration of the COVID-19 declaration under Section 564(b)(1) of the Act, 21 U.S.C. section 360bbb-3(b)(1), unless the authorization is terminated or revoked.  Performed at [facility], [place] [address] [place], [place] [address]   Culture, blood (routine x 2)     Status: Abnormal   Collection Time: 08/12/22  9:33 PM   Specimen: BLOOD  Result Value Ref Range Status   Specimen Description BLOOD SITE NOT SPECIFIED  Final   Special Requests   Final    BOTTLES DRAWN AEROBIC AND ANAEROBIC Blood Culture results may not be optimal due to an excessive volume of blood received in culture bottles   Culture  Setup Time   Final    GRAM POSITIVE COCCI IN PAIRS IN BOTH AEROBIC AND ANAEROBIC BOTTLES CRITICAL RESULT CALLED TO, READ BACK BY AND VERIFIED WITH: PHARMD Tivon LEDFORD ON 08/13/22 @ 6606 BY DRT    Culture (A)  Final    STREPTOCOCCUS PNEUMONIAE SUSCEPTIBILITIES  PERFORMED ON PREVIOUS CULTURE WITHIN THE LAST 5 DAYS. Performed at [facility], [place] [address] [place], [place] [address]    Report Status 08/15/2022 FINAL  Final  Culture, blood (routine x 2)     Status: Abnormal   Collection Time: 08/13/22  2:15 AM   Specimen: BLOOD  Result Value Ref Range Status   Specimen Description BLOOD SITE NOT SPECIFIED  Final   Special Requests   Final    BOTTLES DRAWN AEROBIC AND ANAEROBIC Blood Culture adequate volume   Culture  Setup Time   Final    GRAM POSITIVE COCCI IN PAIRS IN BOTH AEROBIC AND ANAEROBIC BOTTLES CRITICAL RESULT CALLED TO, READ BACK BY AND VERIFIED WITH: PHARMD J. [facility] [id number] _0  FH Performed at [facility], [place] [address], [place], [place] [address]    Culture STREPTOCOCCUS PNEUMONIAE (A)  Final   Report Status 08/15/2022 FINAL  Final   Organism ID, Bacteria STREPTOCOCCUS PNEUMONIAE  Final      Susceptibility   Streptococcus pneumoniae - MIC*    ERYTHROMYCIN <=0.12 SENSITIVE Sensitive     LEVOFLOXACIN 0.5 SENSITIVE Sensitive     VANCOMYCIN <=0.12 SENSITIVE Sensitive     PENICILLIN (meningitis) <=0.06 SENSITIVE Sensitive     PENO - penicillin <=0.06      PENICILLIN (oral) <=0.06 SENSITIVE Sensitive     CEFTRIAXONE (meningitis) <=0.12 SENSITIVE Sensitive     * STREPTOCOCCUS PNEUMONIAE  Blood Culture ID Panel (Reflexed)     Status: Abnormal   Collection Time: 08/13/22  2:15 AM  Result Value Ref Range Status   Enterococcus faecalis NOT DETECTED NOT DETECTED Final   Enterococcus Faecium NOT DETECTED NOT DETECTED Final   Listeria monocytogenes NOT DETECTED NOT DETECTED Final   Staphylococcus species NOT DETECTED NOT DETECTED Final   Staphylococcus aureus (BCID) NOT DETECTED NOT DETECTED Final   Staphylococcus epidermidis NOT DETECTED NOT DETECTED Final   Staphylococcus lugdunensis NOT DETECTED NOT DETECTED Final   Streptococcus species DETECTED (A) NOT DETECTED Final    Comment: CRITICAL RESULT CALLED TO, READ  BACK BY AND VERIFIED WITH: PHARMD J. [place] [id number] _1  FH    Streptococcus agalactiae NOT DETECTED NOT DETECTED Final   Streptococcus pneumoniae DETECTED (A) NOT DETECTED Final    Comment: CRITICAL RESULT CALLED TO, READ BACK BY AND VERIFIED WITH: PHARMD J. [facility] [id number] _2  FH    Streptococcus pyogenes NOT DETECTED NOT DETECTED Final   A.calcoaceticus-baumannii NOT DETECTED NOT DETECTED Final   Bacteroides fragilis NOT DETECTED NOT DETECTED Final   Enterobacterales NOT DETECTED NOT DETECTED Final   Enterobacter cloacae complex NOT DETECTED NOT DETECTED Final   Escherichia coli NOT DETECTED NOT DETECTED Final   Klebsiella aerogenes NOT DETECTED NOT DETECTED Final   Klebsiella oxytoca NOT DETECTED NOT DETECTED Final   Klebsiella pneumoniae NOT DETECTED NOT DETECTED Final   Proteus species NOT DETECTED NOT DETECTED Final   Salmonella species NOT DETECTED NOT DETECTED Final   Serratia marcescens NOT DETECTED NOT DETECTED Final   Haemophilus influenzae NOT DETECTED NOT DETECTED Final   Neisseria meningitidis NOT DETECTED NOT DETECTED Final   Pseudomonas aeruginosa NOT DETECTED NOT DETECTED Final   Stenotrophomonas maltophilia NOT DETECTED NOT DETECTED Final   Candida albicans NOT DETECTED NOT DETECTED Final   Candida auris NOT DETECTED NOT DETECTED Final   Candida glabrata NOT DETECTED NOT DETECTED Final   Candida krusei NOT DETECTED NOT DETECTED Final   Candida parapsilosis NOT DETECTED NOT DETECTED Final   Candida tropicalis NOT DETECTED NOT DETECTED Final   Cryptococcus neoformans/gattii NOT DETECTED NOT DETECTED Final    Comment: Performed at [facility], [address] [place], [place] [address]  Expectorated Sputum Assessment w Gram Stain, Rflx to Resp Cult     Status: None   Collection Time: 08/13/22  2:15 PM   Specimen: Expectorated Sputum  Result Value Ref Range Status   Specimen Description EXPECTORATED SPUTUM  Final   Special Requests NONE  Final   Sputum  evaluation   Final    THIS SPECIMEN IS  ACCEPTABLE FOR SPUTUM CULTURE Performed at [facility], [place] [address], [place], [place] [address]    Report Status 08/14/2022 FINAL  Final  Culture, Respiratory w Gram Stain     Status: None (Preliminary result)   Collection Time: 08/13/22  2:15 PM  Result Value Ref Range Status   Specimen Description EXPECTORATED SPUTUM  Final   Special Requests NONE Reflexed from [id number]  Final   Gram Stain   Final    MODERATE WBC PRESENT, PREDOMINANTLY PMN RARE GRAM POSITIVE COCCI IN PAIRS    Culture   Final    RARE Normal respiratory flora-no Staph aureus or Pseudomonas seen Performed at [facility], [place] [address] [place], [place] [address]    Report Status PENDING  Incomplete    Labs: CBC: Recent Labs  Lab 08/13/22 0215 08/14/22 0656 08/15/22 0816 08/16/22 0148  WBC 19.0* 10.3 12.5* 10.0  NEUTROABS 15.7*  --   --   --   HGB 14.7 13.1 13.9 13.4  HCT 44.3 38.9* 41.3 39.8  MCV 83.7 82.9 83.6 84.5  PLT 345 295 390 242   Basic Metabolic Panel: Recent Labs  Lab 08/13/22 0215 08/13/22 0736 08/14/22 0656 08/15/22 0816 08/16/22 0148  NA 134*  --  134* 141 141  K 2.7*  --  2.9* 3.3* 3.2*  CL 96*  --  100 108 103  CO2 24  --  _0 GLUCOSE 141*  --  310* 208* 134*  BUN 32*  --  41* 32* 22*  CREATININE 1.76*  --  1.59* 1.31* 1.25*  CALCIUM 8.3*  --  7.1* 7.6* 7.9*  MG  --  1.3* 1.6*  --  1.8   Liver Function Tests: Recent Labs  Lab 08/13/22 0215  AST 29  ALT 18  ALKPHOS 56  BILITOT 1.2  PROT 7.0  ALBUMIN 2.8*   CBG: Recent Labs  Lab 08/15/22 0755 08/15/22 1133 08/15/22 1718 08/15/22 2228 08/16/22 0834  GLUCAP 193* 286* 173* 152* 188*    Discharge time spent: greater than 30 minutes.  Signed: Elmarie Shiley, MD [facility] 08/16/2022

## 2022-08-16 NOTE — Progress Notes (Signed)
AVS given and explained to patient, awaiting for ride at 11 am.

## 2022-08-16 NOTE — TOC Transition Note (Signed)
Transition of Care [facility]) - CM/SW Discharge Note   Patient Details  Name: Travis Deleon MRN: [id number] Date of Birth: 03/31/1961  Transition of [facility]) CM/SW Contact:  Bartholomew Crews, RN Phone Number: [phone] 08/16/2022, 10:13 AM   Clinical Narrative:     Patient to transition home today. Now on RA. No TOC needs identified at this time.   Final next level of care: Home/Self Care Barriers to Discharge: No Barriers Identified   Patient Goals and CMS Choice      Discharge Placement                         Discharge Plan and Services Additional resources added to the After Visit Summary for                                       Social Determinants of Health (SDOH) Interventions SDOH Screenings   Tobacco Use: Low Risk  (08/13/2022)     Readmission Risk Interventions     No data to display

## 2022-08-16 NOTE — Discharge Instructions (Signed)
Take 20 meq of potassium daily for only two days.   You have new antibiotics prescription to start taking 08/17/2022 , take it for 6 days.   Stop taking Hydrochlorothiazide. Resume Eplerenone 08/17/2022  Follow up with your PCP for BP check and resolution of infection.

## 2022-08-16 NOTE — Plan of Care (Signed)

## 2022-09-01 NOTE — Progress Notes (Signed)
RN notified patient that we will proceed with coordinating brachytherapy.  No additional needs at this time.   Plan of care in progress.

## 2022-09-14 ENCOUNTER — Other Ambulatory Visit: Payer: Self-pay | Admitting: Urology

## 2022-10-01 ENCOUNTER — Telehealth: Payer: Self-pay | Admitting: *Deleted

## 2022-10-01 NOTE — Telephone Encounter (Signed)
RETURNED PATIENT'S PHONE CALL, SPOKE WITH PATIENT. ?

## 2022-10-17 ENCOUNTER — Emergency Department (HOSPITAL_COMMUNITY): Payer: Managed Care, Other (non HMO)

## 2022-10-17 ENCOUNTER — Other Ambulatory Visit: Payer: Self-pay

## 2022-10-17 ENCOUNTER — Inpatient Hospital Stay (HOSPITAL_COMMUNITY)
Admission: EM | Admit: 2022-10-17 | Discharge: 2022-10-19 | DRG: 291 | Disposition: A | Discharge status: Home / Self Care | Payer: Managed Care, Other (non HMO) | Attending: Family Medicine | Admitting: Family Medicine

## 2022-10-17 ENCOUNTER — Encounter (HOSPITAL_COMMUNITY): Payer: Self-pay | Admitting: Family Medicine

## 2022-10-17 DIAGNOSIS — I5021 Acute systolic (congestive) heart failure: Secondary | ICD-10-CM

## 2022-10-17 DIAGNOSIS — I110 Hypertensive heart disease with heart failure: Principal | ICD-10-CM | POA: Diagnosis present

## 2022-10-17 DIAGNOSIS — I5030 Unspecified diastolic (congestive) heart failure: Secondary | ICD-10-CM

## 2022-10-17 DIAGNOSIS — Z1152 Encounter for screening for COVID-19: Secondary | ICD-10-CM

## 2022-10-17 DIAGNOSIS — I509 Heart failure, unspecified: Secondary | ICD-10-CM

## 2022-10-17 DIAGNOSIS — Z794 Long term (current) use of insulin: Secondary | ICD-10-CM | POA: Diagnosis not present

## 2022-10-17 DIAGNOSIS — Z79899 Other long term (current) drug therapy: Secondary | ICD-10-CM

## 2022-10-17 DIAGNOSIS — I5043 Acute on chronic combined systolic (congestive) and diastolic (congestive) heart failure: Secondary | ICD-10-CM | POA: Diagnosis present

## 2022-10-17 DIAGNOSIS — E669 Obesity, unspecified: Secondary | ICD-10-CM | POA: Diagnosis present

## 2022-10-17 DIAGNOSIS — E1169 Type 2 diabetes mellitus with other specified complication: Secondary | ICD-10-CM | POA: Diagnosis present

## 2022-10-17 DIAGNOSIS — E876 Hypokalemia: Secondary | ICD-10-CM | POA: Diagnosis present

## 2022-10-17 DIAGNOSIS — Z905 Acquired absence of kidney: Secondary | ICD-10-CM

## 2022-10-17 DIAGNOSIS — Z7984 Long term (current) use of oral hypoglycemic drugs: Secondary | ICD-10-CM

## 2022-10-17 DIAGNOSIS — E119 Type 2 diabetes mellitus without complications: Secondary | ICD-10-CM | POA: Diagnosis not present

## 2022-10-17 DIAGNOSIS — E8770 Fluid overload, unspecified: Secondary | ICD-10-CM

## 2022-10-17 DIAGNOSIS — E878 Other disorders of electrolyte and fluid balance, not elsewhere classified: Secondary | ICD-10-CM | POA: Diagnosis present

## 2022-10-17 DIAGNOSIS — Z6841 Body Mass Index (BMI) 40.0 and over, adult: Secondary | ICD-10-CM

## 2022-10-17 DIAGNOSIS — C61 Malignant neoplasm of prostate: Secondary | ICD-10-CM | POA: Diagnosis present

## 2022-10-17 DIAGNOSIS — E785 Hyperlipidemia, unspecified: Secondary | ICD-10-CM | POA: Diagnosis present

## 2022-10-17 LAB — CBC WITH DIFFERENTIAL/PLATELET
Abs Immature Granulocytes: 0.03 10*3/uL (ref 0.00–0.07)
Basophils Absolute: 0.1 10*3/uL (ref 0.0–0.1)
Basophils Relative: 1 %
Eosinophils Absolute: 0.3 10*3/uL (ref 0.0–0.5)
Eosinophils Relative: 4 %
HCT: 43.9 % (ref 39.0–52.0)
Hemoglobin: 14.1 g/dL (ref 13.0–17.0)
Immature Granulocytes: 0 %
Lymphocytes Relative: 19 %
Lymphs Abs: 1.4 10*3/uL (ref 0.7–4.0)
MCH: 27.1 pg (ref 26.0–34.0)
MCHC: 32.1 g/dL (ref 30.0–36.0)
MCV: 84.3 fL (ref 80.0–100.0)
Monocytes Absolute: 0.5 10*3/uL (ref 0.1–1.0)
Monocytes Relative: 7 %
Neutro Abs: 5.1 10*3/uL (ref 1.7–7.7)
Neutrophils Relative %: 69 %
Platelets: 381 10*3/uL (ref 150–400)
RBC: 5.21 MIL/uL (ref 4.22–5.81)
RDW: 13.4 % (ref 11.5–15.5)
WBC: 7.4 10*3/uL (ref 4.0–10.5)
nRBC: 0 % (ref 0.0–0.2)

## 2022-10-17 LAB — COMPREHENSIVE METABOLIC PANEL
ALT: 14 U/L (ref 0–44)
AST: 19 U/L (ref 15–41)
Albumin: 2.7 g/dL — ABNORMAL LOW (ref 3.5–5.0)
Alkaline Phosphatase: 78 U/L (ref 38–126)
Anion gap: 9 (ref 5–15)
BUN: 10 mg/dL (ref 6–20)
CO2: 26 mmol/L (ref 22–32)
Calcium: 8.8 mg/dL — ABNORMAL LOW (ref 8.9–10.3)
Chloride: 104 mmol/L (ref 98–111)
Creatinine, Ser: 1.33 mg/dL — ABNORMAL HIGH (ref 0.61–1.24)
GFR, Estimated: >60 mL/min (ref >60)
Glucose, Bld: 110 mg/dL — ABNORMAL HIGH (ref 70–99)
Potassium: 3.2 mmol/L — ABNORMAL LOW (ref 3.5–5.1)
Sodium: 139 mmol/L (ref 135–145)
Total Bilirubin: 0.4 mg/dL (ref 0.3–1.2)
Total Protein: 6.3 g/dL — ABNORMAL LOW (ref 6.5–8.1)

## 2022-10-17 LAB — RESP PANEL BY RT-PCR (RSV, FLU A&B, COVID)  RVPGX2
Influenza A by PCR: NEGATIVE
Influenza B by PCR: NEGATIVE
Resp Syncytial Virus by PCR: NEGATIVE
SARS Coronavirus 2 by RT PCR: NEGATIVE

## 2022-10-17 LAB — MAGNESIUM: Magnesium: 1.7 mg/dL (ref 1.7–2.4)

## 2022-10-17 LAB — BASIC METABOLIC PANEL
Anion gap: 10 (ref 5–15)
BUN: 10 mg/dL (ref 6–20)
CO2: 26 mmol/L (ref 22–32)
Calcium: 8.9 mg/dL (ref 8.9–10.3)
Chloride: 102 mmol/L (ref 98–111)
Creatinine, Ser: 1.14 mg/dL (ref 0.61–1.24)
GFR, Estimated: >60 mL/min (ref >60)
Glucose, Bld: 136 mg/dL — ABNORMAL HIGH (ref 70–99)
Potassium: 3.2 mmol/L — ABNORMAL LOW (ref 3.5–5.1)
Sodium: 138 mmol/L (ref 135–145)

## 2022-10-17 LAB — TROPONIN I (HIGH SENSITIVITY)
Troponin I (High Sensitivity): 25 ng/L — ABNORMAL HIGH (ref <18)
Troponin I (High Sensitivity): 26 ng/L — ABNORMAL HIGH (ref <18)

## 2022-10-17 LAB — BRAIN NATRIURETIC PEPTIDE: B Natriuretic Peptide: 503.2 pg/mL — ABNORMAL HIGH (ref 0.0–100.0)

## 2022-10-17 LAB — GLUCOSE, CAPILLARY
Glucose-Capillary: 123 mg/dL — ABNORMAL HIGH (ref 70–99)
Glucose-Capillary: 145 mg/dL — ABNORMAL HIGH (ref 70–99)

## 2022-10-17 MED ORDER — INSULIN ASPART 100 UNIT/ML IJ SOLN
0.0000 [IU] | Freq: Three times a day (TID) | INTRAMUSCULAR | Status: DC
  Administered 2022-10-17: 2 [IU] via SUBCUTANEOUS
  Administered 2022-10-18: 5 [IU] via SUBCUTANEOUS
  Administered 2022-10-18 – 2022-10-19 (×2): 3 [IU] via SUBCUTANEOUS

## 2022-10-17 MED ORDER — FUROSEMIDE 10 MG/ML IJ SOLN
40.0000 mg | Freq: Once | INTRAMUSCULAR | Status: AC
  Administered 2022-10-17: 40 mg via INTRAVENOUS
  Filled 2022-10-17: qty 4

## 2022-10-17 MED ORDER — POTASSIUM CHLORIDE CRYS ER 20 MEQ PO TBCR
40.0000 meq | EXTENDED_RELEASE_TABLET | Freq: Once | ORAL | Status: AC
  Administered 2022-10-17: 40 meq via ORAL
  Filled 2022-10-17: qty 2

## 2022-10-17 MED ORDER — INSULIN GLARGINE-YFGN 100 UNIT/ML ~~LOC~~ SOLN
45.0000 [IU] | Freq: Every day | SUBCUTANEOUS | Status: DC
  Administered 2022-10-18 – 2022-10-19 (×2): 45 [IU] via SUBCUTANEOUS
  Filled 2022-10-17 (×4): qty 0.45

## 2022-10-17 MED ORDER — AMLODIPINE BESYLATE 10 MG PO TABS
10.0000 mg | ORAL_TABLET | Freq: Every evening | ORAL | Status: DC
  Administered 2022-10-17 – 2022-10-18 (×2): 10 mg via ORAL
  Filled 2022-10-17 (×2): qty 1

## 2022-10-17 MED ORDER — ENOXAPARIN SODIUM 40 MG/0.4ML IJ SOSY
40.0000 mg | PREFILLED_SYRINGE | INTRAMUSCULAR | Status: DC
  Administered 2022-10-17 – 2022-10-18 (×2): 40 mg via SUBCUTANEOUS
  Filled 2022-10-17 (×2): qty 0.4

## 2022-10-17 MED ORDER — ROSUVASTATIN CALCIUM 5 MG PO TABS
10.0000 mg | ORAL_TABLET | Freq: Every evening | ORAL | Status: DC
  Administered 2022-10-17 – 2022-10-18 (×2): 10 mg via ORAL
  Filled 2022-10-17 (×2): qty 2

## 2022-10-17 MED ORDER — CARVEDILOL 25 MG PO TABS
25.0000 mg | ORAL_TABLET | Freq: Two times a day (BID) | ORAL | Status: DC
  Administered 2022-10-18 – 2022-10-19 (×3): 25 mg via ORAL
  Filled 2022-10-17 (×3): qty 1

## 2022-10-17 MED ORDER — SPIRONOLACTONE 25 MG PO TABS
25.0000 mg | ORAL_TABLET | Freq: Every day | ORAL | Status: DC
  Administered 2022-10-17 – 2022-10-19 (×3): 25 mg via ORAL
  Filled 2022-10-17 (×3): qty 1

## 2022-10-17 MED ORDER — ATENOLOL 100 MG PO TABS
100.0000 mg | ORAL_TABLET | Freq: Every day | ORAL | Status: DC
  Filled 2022-10-17: qty 4

## 2022-10-17 NOTE — ED Notes (Signed)
PT well appearing upon transport to floor.

## 2022-10-17 NOTE — Assessment & Plan Note (Addendum)
UOP of >2L (net  1.5L), weight down about 4-5lb per chart review. Adjusting patient's home medications to align with GDMT. Currently blood pressures are normotensive but lower than expected, will continue to hold Lisinopril for now. - Re-dose IV Lasix '40mg'$  - Strict Is&Os - Daily Weights - Spironolactone '25mg'$  daily (do not have home Eplerenone on formulary) - Carvedilol '25mg'$  BID - Holding home Lisinopril

## 2022-10-17 NOTE — ED Notes (Signed)
ED TO INPATIENT HANDOFF REPORT  ED Nurse Name and Phone #: Duanne Guess [phone]  S Name/Age/Gender Travis Deleon 62 y.o. male Room/Bed: 032C/032C  Code Status   Code Status: Full Code  Home/SNF/Other Home Patient oriented to: self, place, time, and situation Is this baseline? Yes   Triage Complete: Triage complete  Chief Complaint Acute exacerbation of CHF (congestive heart failure) ([place]) [I50.9]  Triage Note Pt brought self into ED for SOB for 1 week and came in today d/t to worsening cough and SOB. Pt reports being diagnoses with PNA about 2 months ago and feels similar. 96% on RA. Pt is able to speak full sentences without stopping and has an occasional cough. A&Ox4, NAD at this time.    Allergies Allergies  Allergen Reactions   Aspirin Hives and Rash    Level of Care/Admitting Diagnosis ED Disposition     ED Disposition  Admit   Condition  --   [place]: [place] [id number]  Level of Care: Med-Surg [16]  May place patient in observation at [facility] or [facility] if equivalent level of care is available:: No  Covid Evaluation: Confirmed COVID Negative  Diagnosis: Acute exacerbation of CHF (congestive heart failure) [facility]) [id number]  Admitting Physician: Salvadore Oxford [id number]  Attending Physician: Andrena Mews T [[id number]]          B Medical/Surgery History Past Medical History:  Diagnosis Date   Diabetes mellitus without complication ([place])    Hypertension    Past Surgical History:  Procedure Laterality Date   NEPHRECTOMY     staghorn calculus, 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   skin cancer removal       A IV Location/Drains/Wounds Patient Lines/Drains/Airways Status     Active Line/Drains/Airways     Name Placement date Placement time Site Days   Peripheral IV 10/17/22 18 G Right Antecubital 10/17/22  0845  Antecubital  less than 1            Intake/Output Last 24 hours No intake or output data in the 24  hours ending 10/17/22 1232  Labs/Imaging Results for orders placed or performed during the hospital encounter of 10/17/22 (from the past 48 hour(s))  Brain natriuretic peptide     Status: Abnormal   Collection Time: 10/17/22  8:48 AM  Result Value Ref Range   B Natriuretic Peptide 503.2 (H) 0.0 - 100.0 pg/mL    Comment: Performed at [facility] [address], [place],  [address]  CBC with Differential/Platelet     Status: None   Collection Time: 10/17/22  8:48 AM  Result Value Ref Range   WBC 7.4 4.0 - 10.5 K/uL   RBC 5.21 4.22 - 5.81 MIL/uL   Hemoglobin 14.1 13.0 - 17.0 g/dL   HCT 43.9 39.0 - 52.0 %   MCV 84.3 80.0 - 100.0 fL   MCH 27.1 26.0 - 34.0 pg   MCHC 32.1 30.0 - 36.0 g/dL   RDW 13.4 11.5 - 15.5 %   Platelets 381 150 - 400 K/uL   nRBC 0.0 0.0 - 0.2 %   Neutrophils Relative % 69 %   Neutro Abs 5.1 1.7 - 7.7 K/uL   Lymphocytes Relative 19 %   Lymphs Abs 1.4 0.7 - 4.0 K/uL   Monocytes Relative 7 %   Monocytes Absolute 0.5 0.1 - 1.0 K/uL   Eosinophils Relative 4 %   Eosinophils Absolute 0.3 0.0 - 0.5 K/uL   Basophils Relative 1 %  Basophils Absolute 0.1 0.0 - 0.1 K/uL   Immature Granulocytes 0 %   Abs Immature Granulocytes 0.03 0.00 - 0.07 K/uL    Comment: Performed at [facility], [place] [address], [place], [place] [address]  Comprehensive metabolic panel     Status: Abnormal   Collection Time: 10/17/22  8:48 AM  Result Value Ref Range   Sodium 139 135 - 145 mmol/L   Potassium 3.2 (L) 3.5 - 5.1 mmol/L   Chloride 104 98 - 111 mmol/L   CO2 26 22 - 32 mmol/L   Glucose, Bld 110 (H) 70 - 99 mg/dL    Comment: Glucose reference range applies only to samples taken after fasting for at least 8 hours.   BUN 10 6 - 20 mg/dL   Creatinine, Ser 1.33 (H) 0.61 - 1.24 mg/dL   Calcium 8.8 (L) 8.9 - 10.3 mg/dL   Total Protein 6.3 (L) 6.5 - 8.1 g/dL   Albumin 2.7 (L) 3.5 - 5.0 g/dL   AST 19 15 - 41 U/L   ALT 14 0 - 44 U/L   Alkaline Phosphatase 78 38 - 126  U/L   Total Bilirubin 0.4 0.3 - 1.2 mg/dL   GFR, Estimated >60 >60 mL/min    Comment: (NOTE) Calculated using the CKD-EPI Creatinine Equation (2021)    Anion gap 9 5 - 15    Comment: Performed at [facility], [place] [address] [place], [place] [address]  Magnesium     Status: None   Collection Time: 10/17/22  8:48 AM  Result Value Ref Range   Magnesium 1.7 1.7 - 2.4 mg/dL    Comment: Performed at [facility] [address] [place], [place] [address]  Troponin I (High Sensitivity)     Status: Abnormal   Collection Time: 10/17/22  8:48 AM  Result Value Ref Range   Troponin I (High Sensitivity) 25 (H) <18 ng/L    Comment: (NOTE) Elevated high sensitivity troponin I (hsTnI) values and significant  changes across serial measurements may suggest ACS but many other  chronic and acute conditions are known to elevate hsTnI results.  Refer to the "Links" section for chest pain algorithms and additional  guidance. Performed at [facility], [place] [address] [place], [place] [address]   Troponin I (High Sensitivity)     Status: Abnormal   Collection Time: 10/17/22 10:55 AM  Result Value Ref Range   Troponin I (High Sensitivity) 26 (H) <18 ng/L    Comment: (NOTE) Elevated high sensitivity troponin I (hsTnI) values and significant  changes across serial measurements may suggest ACS but many other  chronic and acute conditions are known to elevate hsTnI results.  Refer to the "Links" section for chest pain algorithms and additional  guidance. Performed at [facility], [place] [address] [place], [place] [address]   Resp panel by RT-PCR (RSV, Flu A&B, Covid)     Status: None   Collection Time: 10/17/22 11:00 AM  Result Value Ref Range   SARS Coronavirus 2 by RT PCR NEGATIVE NEGATIVE   Influenza A by PCR NEGATIVE NEGATIVE   Influenza B by PCR NEGATIVE NEGATIVE    Comment: (NOTE) The Xpert Xpress SARS-CoV-2/FLU/RSV plus assay is intended as an aid in the diagnosis  of influenza from Nasopharyngeal swab specimens and should not be used as a sole basis for treatment. Nasal washings and aspirates are unacceptable for Xpert Xpress SARS-CoV-2/FLU/RSV testing.  Fact Sheet for Patients: EntrepreneurPulse.com.au  Fact Sheet for Healthcare Providers: IncredibleEmployment.be  This test is not yet approved or cleared by the [place] FDA and has been authorized for detection and/or diagnosis of SARS-CoV-2 by FDA under an Emergency Use Authorization (EUA). This EUA will remain in effect (meaning this test can be used) for the duration of the COVID-19 declaration under Section 564(b)(1) of the Act, 21 U.S.C. section 360bbb-3(b)(1), unless the authorization is terminated or revoked.     Resp Syncytial Virus by PCR NEGATIVE NEGATIVE    Comment: (NOTE) Fact Sheet for Patients: EntrepreneurPulse.com.au  Fact Sheet for Healthcare Providers: IncredibleEmployment.be  This test is not yet approved or cleared by the [place] FDA and has been authorized for detection and/or diagnosis of SARS-CoV-2 by FDA under an Emergency Use Authorization (EUA). This EUA will remain in effect (meaning this test can be used) for the duration of the COVID-19 declaration under Section 564(b)(1) of the Act, 21 U.S.C. section 360bbb-3(b)(1), unless the authorization is terminated or revoked.  Performed at [facility], [place] [address] [place], [place] [address]    DG Chest Port 1 View  Result Date: 10/17/2022 CLINICAL DATA:  Dyspnea. Shortness of breath with wheezing for 10 days EXAM: PORTABLE CHEST 1 VIEW COMPARISON:  08/12/2022 FINDINGS: Cardiomegaly. Hazy density at the medial right base is likely resolved, although limited by technique and underpenetration. No mass in this area on 09/23 PET-CT. IMPRESSION: Low volume chest with cardiomegaly and vascular congestion. Electronically Signed    By: Jorje Guild M.D.   On: 10/17/2022 09:12    Pending Labs Unresulted Labs (From admission, onward)     Start     Ordered   10/18/22 [id number]  Basic metabolic panel  Tomorrow morning,   R        10/17/22 1224   10/17/22 [id number]  Basic metabolic panel  Once-Timed,   TIMED        10/17/22 1224            Vitals/Pain Today's Vitals   10/17/22 0827 10/17/22 0830 10/17/22 0930 10/17/22 1030  BP:  (!) 147/80 (!) 156/107 (!) 153/93  Pulse:  63 64 66  Resp:  19 (!) 30 (!) 25  Temp:      TempSrc:      SpO2: 95% 98% 96% 97%  Weight:      Height:      PainSc: 0-No pain       Isolation Precautions No active isolations  Medications Medications  amLODipine (NORVASC) tablet 10 mg (has no administration in time range)  atenolol (TENORMIN) tablet 100 mg (has no administration in time range)  spironolactone (ALDACTONE) tablet 25 mg (has no administration in time range)  rosuvastatin (CRESTOR) tablet 10 mg (has no administration in time range)  insulin glargine-yfgn (SEMGLEE) injection 45 Units (has no administration in time range)  enoxaparin (LOVENOX) injection 40 mg (has no administration in time range)  insulin aspart (novoLOG) injection 0-15 Units (has no administration in time range)  potassium chloride SA (KLOR-CON M) CR tablet 40 mEq (40 mEq Oral Given 10/17/22 1056)  furosemide (LASIX) injection 40 mg (40 mg Intravenous Given 10/17/22 1057)    Mobility walks     Focused Assessments Cardiac Assessment Handoff:  Cardiac Rhythm: Normal sinus rhythm No results found for: "CKTOTAL", "CKMB", "CKMBINDEX", "TROPONINI" No results found for: "DDIMER" Does the Patient currently have chest pain? No   , Pulmonary Assessment Handoff:  Lung sounds: Bilateral Breath Sounds: Diminished O2 Device: Room Air  R Recommendations: See Admitting Provider Note  Report given to:   Additional Notes:    

## 2022-10-17 NOTE — H&P (Cosign Needed Addendum)
Hospital Admission History and Physical Service Pager: [phone]  Patient name: Travis Deleon Medical record number: [id number] Date of Birth: 1961-03-27 Age: 62 y.o. Gender: male  Primary Care Provider: [name], MD Consultants: None Code Status: Full Code  Preferred Emergency Contact:  Contact Information     Name Relation Home Work [place]   [phone]        Chief Complaint: Leg swelling  Assessment and Plan: Travis Deleon is a 63 y.o. male presenting with leg swelling and orthopnea. Differential for this patient's presentation of this includes heart failure exacerbation, pneumonia, DVT, PE.  Clinical presentation and newfound HFpEF diagnosed on echo 2 months ago makes heart failure exacerbation most likely diagnosis.  Patient does not have fever or elevated white blood cell count or chest x-ray consistent with pneumonia.  DVT/PE less likely due to negative Homans' sign, no tachycardia.  * Acute exacerbation of CHF (congestive heart failure) (HCC) Respiratory status stable.  Clinically consistent with heart failure exacerbation and additionally supported by BNP 503 and Chest x-ray demonstrating bilateral pulmonary edema. -Admit to family medicine teaching service, MedSurg, attending Dr. Gwendlyn Deutscher -Status post 40 mg IV Lasix -Redose Lasix in a.m. -Strict I's and O's -Daily weights -Continue home eplerenone or formulary alternative spironolactone -Transition home atenolol to carvedilol, per GDMT guidelines -Hold home lisinopril -P.m. BMP -AM BMP -Vitals per floor, with oxygen saturation spot checks  Diabetes mellitus type 2 in obese (HCC) Last A1c 11.3. 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  Creatinine elevated to 1.33.  Baseline  appears to be 1.3-1.5.  Hypokalemic to 3.2, status post 40 meq repletion in ED.  Reportedly follows with nephrologist. -P.m. BMP, a.m. BMP -Monitor kidney function closely  Chronic Stable Conditions: Prostate cancer - patient follows with [facility]. Hyperlipidemia - continue Crestor 10 mg   FEN/GI: Heart healthy, carb modified VTE Prophylaxis: Lovenox  Disposition: Pending clinical improvement  History of Present Illness:  Travis Deleon is a 62 y.o. male presenting with SOB  SOB about 10 days ago. Does have exertional dyspnea with short distance work. Also have difficulty laying flat on the back and would go to sit on the louch during this time. He also started noticing leg swelling 2 weeks ago. No chest pain but difficulty with deep breath that felt like pressure. No fevers but has dry cough.  No congestion, no nausea, no vomiting.  Endorses normal bowel movements and normal urination.  Of note 2 months ago was admitted for PNA and at that admission is HCTZ was discontinued and a month later he started noticing swelling in his legs bilaterally.  This admission also notable for echo showing heart failure with diastolic dysfunction.  In the ED, vital stable with mild hypertension, patient not requiring oxygen.  Labs significant for BNP of 503, creatinine of 1.33, magnesium 1.7, troponin 25-> 26.  Checks x-ray demonstrating bilateral pulmonary edema.  Patient was given 1 dose IV 40 mg Lasix.  Review Of Systems: Per HPI.  Pertinent Past Medical History: HTN Sleep Apnea T2DM Prostate Cancer  Calculi requiring removal with nephrectomy. Remainder reviewed in history tab.   Pertinent Past Surgical History: Nephrectomy. Remainder reviewed in history tab.  Pertinent Social History: Tobacco use: Never Alcohol use: 1 to 2 x 1 drink monthly  Other Substance use: None Lives with wife and son   Pertinent Family History: Dad- Prostate cancer, HTN Maternal Grandfather- DM  Remainder  reviewed in history tab.   Important Outpatient Medications: Insulin tresiba 90u Metforming '1000mg'$  Atenolol '100mg'$   Glipizide '20mg'$  lisinopril '40mg'$  Eplerenone '100mg'$   Rosuvastatin '10mg'$  Amilodipine 10 mg Remainder reviewed in medication history.   Objective: BP (!) 151/77 (BP Location: Left Arm)   Pulse 71   Temp 98.4 F (36.9 C) (Oral)   Resp 20   Ht '5\' 9"'$  (1.753 m)   Wt 129.3 kg   SpO2 96%   BMI 42.09 kg/m  Exam: General: A&O, NAD, lying comfortably in hospital bed HEENT: No sign of trauma, EOM grossly intact, moist mucous membranes Cardiac: RRR, no m/r/g, 2+ pitting edema bilaterally up to tibial tuberosity Respiratory: Bilateral basilar crackles, good air movement throughout, normal WOB GI: Soft, NTTP, non-distended, no rebound or guarding,  Extremities: NTTP, radial pulses and dorsalis pedis pulses 2+ bilaterally Neuro: Moves all four extremities appropriately. Psych: Appropriate mood and affect   Labs:  CBC BMET  Recent Labs  Lab 10/17/22 0848  WBC 7.4  HGB 14.1  HCT 43.9  PLT 381   Recent Labs  Lab 10/17/22 0848  NA 139  K 3.2*  CL 104  CO2 26  BUN 10  CREATININE 1.33*  GLUCOSE 110*  CALCIUM 8.8*    Pertinent additional labs: -BNP 503  EKG: My own interpretation: Sinus rhythm   Imaging Studies Performed:  Imaging Study (ie. Chest x-ray) Impression from Radiologist: Low volume chest with cardiomegaly and vascular congestion.    My Interpretation: Bilateral lower lobe infiltrates, diaphragmatic angles obscured, suggestive of pulmonary edema   Salvadore Oxford, MD 10/17/2022, 3:37 PM PGY-1, [place] Intern pager: [phone], text pages welcome Secure chat group Travis Deleon

## 2022-10-17 NOTE — H&P (Signed)
  62 Y/O M with PMHx of DM2, HTN, Prostate CA, HLD, and solitary kidney presents with worsening SOB on exertion and associated orthopnea.  Exam: Gen: No distress, obese, HEENT: EOMI, PERRLA, no thyromegaly. Heart: S1 S2 normal, no murmurs. RRR Lungs: Air entry equal B/L Abd: Soft, NT/ND, normoactive bowel sound Ext: ++ B/L pedal edema.  A/P: Acute diastolic CHF exacerbation A recent ECHO done 3 months ago  showed an EF of 60-65% with GIIDD BNP of 503 and troponin of 26. Chest x-ray showed cardiomegaly and vascular congestion He is s/o one IV dose of Lasix  40 mg Obtain risk stratification labs - FLP, A1C, HIV, RPR, TSH Monitor I/Os and re-dose Lasix as needed. Will likely need to go home with Lasix. He already had his home Atenolol today. Hence, we will transition to Coreg or Metoprolol tomorrow. Continue Spironolactone, Noravasc and Crestor. Monitor his BP closely while on these meds.  For the rest of his chronic problems, see the resident's note below.

## 2022-10-17 NOTE — Assessment & Plan Note (Addendum)
Last A1c 11.3. -Hold home metformin and glipizide -Home dose insulin 90 units daily of Tresiba -Start 45 units Semglee daily -Moderate sliding scale -CBG monitoring with meals and at night -AM A1c

## 2022-10-17 NOTE — ED Triage Notes (Signed)
Pt brought self into ED for SOB for 1 week and came in today d/t to worsening cough and SOB. Pt reports being diagnoses with PNA about 2 months ago and feels similar. 96% on RA. Pt is able to speak full sentences without stopping and has an occasional cough. A&Ox4, NAD at this time.

## 2022-10-17 NOTE — ED Provider Notes (Signed)
[facility] Provider Note   CSN: [id number] Arrival date & time: 10/17/22  [id number]     History  Chief Complaint  Patient presents with   Shortness of Breath    Travis Deleon is a 62 y.o. male.  62 year old male with past medical history significant for diabetes, high blood pressure, prostate cancer who presents today for evaluation of shortness of breath for the past 10 days.  Reports dyspnea on exertion that has been progressively worsening, with associated orthopnea, PND, and peripheral edema.  He states this feels similar to his prior episode a few months ago where he was diagnosed with pneumonia and admitted to the hospital for 3 days.  However he states the orthopnea, peripheral edema is new.  He developed dry cough last night.  No fever.  Denies any chest pain, lightheadedness.  The history is provided by the patient. No language interpreter was used.       Home Medications Prior to Admission medications   Medication Sig Start Date End Date Taking? Authorizing Provider  amLODipine (NORVASC) 10 MG tablet Take 10 mg by mouth every evening.    [provider]  atenolol (TENORMIN) 100 MG tablet Take 100 mg by mouth daily.    [provider]  dextromethorphan (DELSYM) 30 MG/5ML liquid Take 30 mg by mouth 2 (two) times daily as needed for cough.    [provider]  eplerenone (INSPRA) 50 MG tablet Take 100 mg by mouth daily.    [provider]  glipiZIDE (GLUCOTROL) 10 MG tablet Take 20 mg by mouth daily before breakfast. 06/22/18   [provider]  guaiFENesin (MUCINEX) 600 MG 12 hr tablet Take 2 tablets (1,200 mg total) by mouth 2 (two) times daily. 08/16/22   Regalado, Belkys A, MD  insulin degludec (TRESIBA) 200 UNIT/ML FlexTouch Pen Inject 90 Units into the skin daily. 04/13/22   [provider]  magnesium oxide (MAG-OX) 400 (240 Mg) MG tablet Take 0.5 tablets (200 mg total) by mouth 2  (two) times daily. 08/16/22   Regalado, Belkys A, MD  metFORMIN (GLUCOPHAGE) 1000 MG tablet Take 1,000 mg by mouth 2 (two) times daily with a meal.    [provider]  ondansetron (ZOFRAN ODT) 4 MG disintegrating tablet Take 1 tablet (4 mg total) by mouth every 8 (eight) hours as needed for nausea or vomiting. 08/31/18   Recardo Evangelist, PA-C  potassium chloride SA (KLOR-CON M) 20 MEQ tablet Take 1 tablet (20 mEq total) by mouth daily for 2 days. 08/16/22 08/18/22  Regalado, Belkys A, MD  rosuvastatin (CRESTOR) 10 MG tablet Take 10 mg by mouth every evening.    [provider]  Semaglutide,0.25 or 0.'5MG'$ /DOS, (OZEMPIC, 0.25 OR 0.5 MG/DOSE,) 2 MG/3ML SOPN Inject 0.5 mg into the skin every Sunday. Patient not taking: Reported on 08/13/2022 03/17/22   [provider]      Allergies    Aspirin    Review of Systems   Review of Systems  Constitutional:  Negative for chills and fever.  Respiratory:  Positive for cough and shortness of breath.   Cardiovascular:  Negative for chest pain.  Gastrointestinal:  Negative for abdominal pain, nausea and vomiting.  Neurological:  Negative for syncope and light-headedness.  All other systems reviewed and are negative.   Physical Exam Updated Vital Signs BP (!) 151/85 (BP Location: Right Arm)   Pulse 63   Temp 99 F (37.2 C) (Oral)   Resp 19  Ht '5\' 9"'$  (1.753 m)   Wt 129.3 kg   SpO2 95%   BMI 42.09 kg/m  Physical Exam Vitals and nursing note reviewed.  Constitutional:      General: He is not in acute distress.    Appearance: Normal appearance. He is not ill-appearing.  HENT:     Head: Normocephalic and atraumatic.     Nose: Nose normal.  Eyes:     General: No scleral icterus.    Extraocular Movements: Extraocular movements intact.     Conjunctiva/sclera: Conjunctivae normal.  Cardiovascular:     Rate and Rhythm: Normal rate and regular rhythm.     Pulses: Normal pulses.  Pulmonary:     Effort: Pulmonary effort  is normal. No respiratory distress.     Breath sounds: Normal breath sounds. No wheezing or rales.  Abdominal:     General: There is no distension.     Palpations: Abdomen is soft.     Tenderness: There is no abdominal tenderness. There is no guarding.  Musculoskeletal:        General: Normal range of motion.     Cervical back: Normal range of motion.     Right lower leg: Edema present.     Left lower leg: Edema present.  Skin:    General: Skin is warm and dry.  Neurological:     General: No focal deficit present.     Mental Status: He is alert. Mental status is at baseline.     ED Results / Procedures / Treatments   Labs (all labs ordered are listed, but only abnormal results are displayed) Labs Reviewed  RESP PANEL BY RT-PCR (RSV, FLU A&B, COVID)  RVPGX2  BRAIN NATRIURETIC PEPTIDE  CBC WITH DIFFERENTIAL/PLATELET  COMPREHENSIVE METABOLIC PANEL  MAGNESIUM  TROPONIN I (HIGH SENSITIVITY)    EKG None  Radiology No results found.  Procedures Procedures    Medications Ordered in ED Medications - No data to display  ED Course/ Medical Decision Making/ A&P                             Medical Decision Making Amount and/or Complexity of Data Reviewed Labs: ordered. Radiology: ordered.  Risk Prescription drug management.   Medical Decision Making / ED Course   This patient presents to the ED for concern of shortness of breath, this involves an extensive number of treatment options, and is a complaint that carries with it a high risk of complications and morbidity.  The differential diagnosis includes pneumonia, CHF exacerbation, PE, viral URI, sinusitis  MDM: 62 year old male presents with above complaint.  He is well-appearing.  Does have peripheral edema, complains of orthopnea and PND.  Denies known history of CHF.  Recently admitted for pneumonia.  Was taken off of his hydrochlorothiazide during that admission.  Not currently on a diuretic.   Workup reveals  CBC which is unremarkable.  CMP shows creatinine 1.33 which is around his baseline.  Troponin of 25.  BNP of 503.  Magnesium 1.7.  Chest x-ray with signs of volume overload otherwise no acute cardiopulmonary process.  EKG without acute ischemic changes.  Workup is consistent with signs of CHF exacerbation.  Previous echo done during recent admission shows diastolic dysfunction.  Patient has history of solitary kidney.  Following a shared decision making patient prefers admission for diuresis with close monitoring of his renal function.  Will discuss with admitting team.  Discussed with family practice who will  evaluate patient to determine need for admission.   Additional history obtained: -Additional history obtained from recent admission -External records from outside source obtained and reviewed including: Chart review including previous notes, labs, imaging, consultation notes   Lab Tests: -I ordered, reviewed, and interpreted labs.   The pertinent results include:   Labs Reviewed  BRAIN NATRIURETIC PEPTIDE - Abnormal; Notable for the following components:      Result Value   B Natriuretic Peptide 503.2 (*)    All other components within normal limits  COMPREHENSIVE METABOLIC PANEL - Abnormal; Notable for the following components:   Potassium 3.2 (*)    Glucose, Bld 110 (*)    Creatinine, Ser 1.33 (*)    Calcium 8.8 (*)    Total Protein 6.3 (*)    Albumin 2.7 (*)    All other components within normal limits  TROPONIN I (HIGH SENSITIVITY) - Abnormal; Notable for the following components:   Troponin I (High Sensitivity) 25 (*)    All other components within normal limits  RESP PANEL BY RT-PCR (RSV, FLU A&B, COVID)  RVPGX2  CBC WITH DIFFERENTIAL/PLATELET  MAGNESIUM  TROPONIN I (HIGH SENSITIVITY)      EKG  EKG Interpretation  Date/Time:  Saturday October 17 2022 08:19:33 EST Ventricular Rate:  68 PR Interval:  158 QRS Duration: 102 QT Interval:  397 QTC Calculation: 423 R  Axis:   55 Text Interpretation: Sinus rhythm Nonspecific T wave abnormality Confirmed by Lajean Saver [phone]) on 10/17/2022 10:57:30 AM         Imaging Studies ordered: I ordered imaging studies including chest x-ray I independently visualized and interpreted imaging. I agree with the radiologist interpretation   Medicines ordered and prescription drug management: Meds ordered this encounter  Medications   potassium chloride SA (KLOR-CON M) CR tablet 40 mEq   furosemide (LASIX) injection 40 mg    -I have reviewed the patients home medicines and have made adjustments as needed  Critical interventions IV diuresis, potassium supplement   Reevaluation: After the interventions noted above, I reevaluated the patient and found that they have :stayed the same  Co morbidities that complicate the patient evaluation  Past Medical History:  Diagnosis Date   Diabetes mellitus without complication ([place])    Hypertension       Dispostion: Patient discussed with family practice team who will evaluate patient.   Final Clinical Impression(s) / ED Diagnoses Final diagnoses:  Hypervolemia, unspecified hypervolemia type    Rx / DC Orders ED Discharge Orders     None         Evlyn Courier, PA-C 10/17/22 1144    Lajean Saver, MD 10/18/22 1214

## 2022-10-17 NOTE — Progress Notes (Signed)
FMTS Brief Progress Note  S:Patient reports that he feels like his breathing has improved since admission. He reports good amount of urination.  Has no concerns at this time.    O: BP 135/63   Pulse 73   Temp 98.4 F (36.9 C) (Oral)   Resp 18   Ht '5\' 9"'$  (1.753 m)   Wt 129.3 kg   SpO2 95%   BMI 42.09 kg/m   General: NAD, laying on left side in bed CV: RRR, no murmur appreciated Resp: CTAB, normal WOB on room air Extremities: 2+ pitting edema in BLE (mildly worse in left leg)  A/P: Acute diastolic CHF exacerbation Patient is stable, has received 1 dose of Lasix and is naive to diuretics so will continue to monitor and likely re-dose lasix in the AM - Orders reviewed. Labs for AM ordered, which was adjusted as needed.   Rise Patience, DO 10/17/2022, 10:08 PM PGY-3, [facility] Night Resident  Please page [phone] with questions.

## 2022-10-17 NOTE — Assessment & Plan Note (Addendum)
Status post nephrectomy for renal calculi.  Creatinine elevated to 1.33.  Baseline appears to be 1.3-1.5.  Hypokalemic to 3.2, status post 40 meq repletion in ED.  Reportedly follows with nephrologist. -P.m. BMP, a.m. BMP -Monitor kidney function closely

## 2022-10-18 ENCOUNTER — Observation Stay (HOSPITAL_COMMUNITY): Payer: Managed Care, Other (non HMO)

## 2022-10-18 DIAGNOSIS — E8770 Fluid overload, unspecified: Secondary | ICD-10-CM

## 2022-10-18 DIAGNOSIS — E669 Obesity, unspecified: Secondary | ICD-10-CM | POA: Diagnosis present

## 2022-10-18 DIAGNOSIS — Z7984 Long term (current) use of oral hypoglycemic drugs: Secondary | ICD-10-CM | POA: Diagnosis not present

## 2022-10-18 DIAGNOSIS — I110 Hypertensive heart disease with heart failure: Secondary | ICD-10-CM | POA: Diagnosis present

## 2022-10-18 DIAGNOSIS — C61 Malignant neoplasm of prostate: Secondary | ICD-10-CM | POA: Diagnosis present

## 2022-10-18 DIAGNOSIS — Z794 Long term (current) use of insulin: Secondary | ICD-10-CM | POA: Diagnosis not present

## 2022-10-18 DIAGNOSIS — E119 Type 2 diabetes mellitus without complications: Secondary | ICD-10-CM | POA: Diagnosis present

## 2022-10-18 DIAGNOSIS — I5023 Acute on chronic systolic (congestive) heart failure: Secondary | ICD-10-CM | POA: Diagnosis not present

## 2022-10-18 DIAGNOSIS — E785 Hyperlipidemia, unspecified: Secondary | ICD-10-CM | POA: Diagnosis present

## 2022-10-18 DIAGNOSIS — E876 Hypokalemia: Secondary | ICD-10-CM | POA: Diagnosis present

## 2022-10-18 DIAGNOSIS — Z1152 Encounter for screening for COVID-19: Secondary | ICD-10-CM | POA: Diagnosis not present

## 2022-10-18 DIAGNOSIS — I5021 Acute systolic (congestive) heart failure: Secondary | ICD-10-CM | POA: Diagnosis not present

## 2022-10-18 DIAGNOSIS — Z905 Acquired absence of kidney: Secondary | ICD-10-CM | POA: Diagnosis not present

## 2022-10-18 DIAGNOSIS — I509 Heart failure, unspecified: Secondary | ICD-10-CM | POA: Diagnosis present

## 2022-10-18 DIAGNOSIS — I5043 Acute on chronic combined systolic (congestive) and diastolic (congestive) heart failure: Secondary | ICD-10-CM | POA: Diagnosis present

## 2022-10-18 DIAGNOSIS — Z79899 Other long term (current) drug therapy: Secondary | ICD-10-CM | POA: Diagnosis not present

## 2022-10-18 DIAGNOSIS — Z6841 Body Mass Index (BMI) 40.0 and over, adult: Secondary | ICD-10-CM | POA: Diagnosis not present

## 2022-10-18 LAB — LIPID PANEL
Cholesterol: 98 mg/dL (ref 0–200)
HDL: 29 mg/dL — ABNORMAL LOW (ref >40)
LDL Cholesterol: 50 mg/dL (ref 0–99)
Total CHOL/HDL Ratio: 3.4 RATIO
Triglycerides: 93 mg/dL (ref <150)
VLDL: 19 mg/dL (ref 0–40)

## 2022-10-18 LAB — BASIC METABOLIC PANEL
Anion gap: 9 (ref 5–15)
BUN: 9 mg/dL (ref 6–20)
CO2: 25 mmol/L (ref 22–32)
Calcium: 8.5 mg/dL — ABNORMAL LOW (ref 8.9–10.3)
Chloride: 104 mmol/L (ref 98–111)
Creatinine, Ser: 1.17 mg/dL (ref 0.61–1.24)
GFR, Estimated: >60 mL/min (ref >60)
Glucose, Bld: 113 mg/dL — ABNORMAL HIGH (ref 70–99)
Potassium: 3.1 mmol/L — ABNORMAL LOW (ref 3.5–5.1)
Sodium: 138 mmol/L (ref 135–145)

## 2022-10-18 LAB — GLUCOSE, CAPILLARY
Glucose-Capillary: 115 mg/dL — ABNORMAL HIGH (ref 70–99)
Glucose-Capillary: 204 mg/dL — ABNORMAL HIGH (ref 70–99)
Glucose-Capillary: 180 mg/dL — ABNORMAL HIGH (ref 70–99)
Glucose-Capillary: 334 mg/dL — ABNORMAL HIGH (ref 70–99)

## 2022-10-18 LAB — MAGNESIUM: Magnesium: 1.6 mg/dL — ABNORMAL LOW (ref 1.7–2.4)

## 2022-10-18 LAB — TSH: TSH: 0.622 u[IU]/mL (ref 0.350–4.500)

## 2022-10-18 LAB — TROPONIN I (HIGH SENSITIVITY)
Troponin I (High Sensitivity): 21 ng/L — ABNORMAL HIGH (ref <18)
Troponin I (High Sensitivity): 22 ng/L — ABNORMAL HIGH (ref <18)

## 2022-10-18 LAB — RPR: RPR Ser Ql: NONREACTIVE

## 2022-10-18 MED ORDER — FUROSEMIDE 10 MG/ML IJ SOLN
40.0000 mg | Freq: Once | INTRAMUSCULAR | Status: AC
  Administered 2022-10-18: 40 mg via INTRAVENOUS
  Filled 2022-10-18: qty 4

## 2022-10-18 MED ORDER — MAGNESIUM SULFATE IN D5W 1-5 GM/100ML-% IV SOLN
1.0000 g | Freq: Once | INTRAVENOUS | Status: DC
  Filled 2022-10-18: qty 100

## 2022-10-18 MED ORDER — MAGNESIUM SULFATE 2 GM/50ML IV SOLN
2.0000 g | Freq: Once | INTRAVENOUS | Status: AC
  Administered 2022-10-18: 2 g via INTRAVENOUS
  Filled 2022-10-18: qty 50

## 2022-10-18 MED ORDER — FUROSEMIDE 40 MG PO TABS
40.0000 mg | ORAL_TABLET | Freq: Once | ORAL | Status: AC
  Administered 2022-10-18: 40 mg via ORAL
  Filled 2022-10-18: qty 1

## 2022-10-18 MED ORDER — POTASSIUM CHLORIDE CRYS ER 20 MEQ PO TBCR
40.0000 meq | EXTENDED_RELEASE_TABLET | ORAL | Status: AC
  Administered 2022-10-18 (×2): 40 meq via ORAL
  Filled 2022-10-18 (×2): qty 2

## 2022-10-18 MED ORDER — FUROSEMIDE 40 MG PO TABS
40.0000 mg | ORAL_TABLET | Freq: Every day | ORAL | Status: DC
  Administered 2022-10-19: 40 mg via ORAL
  Filled 2022-10-18: qty 1

## 2022-10-18 NOTE — Progress Notes (Addendum)
FMTS Interim Progress Note  S: Received page from RN about new onset SOB after the patient got up to use the restroom. Patient continued to be SOB intermittently at rest. Evaluated the patient at bedside. He reports intermittent chest pressure that is similar to his presentation on admission when he was volume overloaded. He appears comfortable and unlabored. Denies severe chest pain, vomiting, abdominal pain.    O: BP 137/66 (BP Location: Right Arm)   Pulse 64   Temp 98.2 F (36.8 C) (Oral)   Resp 16   Ht '5\' 9"'$  (1.753 m)   Wt 127.4 kg   SpO2 95%   BMI 41.47 kg/m   Well appearing, no acute distress Cardio: regular rate, regular rhythm, no murmurs on exam  Pulm: clear, no wheezing, no crackles appreciated on my exam. No increased WOB Extremities: +2 pitting edema   A/P: SOB in the setting of Acute CHF Exacerbation:  Appears stable clinically and symptoms are consistent with previous presentation. Volume overloaded on exam. Since patient has acute onset of symptoms will order CXR and EKG to rule out cardiac or other pulmonary etiology.  - re-dose 40 mg IV lasix  - holding patient overnight, possible DC tomorrow  - Strict I&Os with fluid restriction diet  - Transition to PO lasix tomorrow, 40 mg ordered   Addendum 1100:  EKG with changes from prior on 3/2. Now showing PAC and reading as age indeterminate septal infarct. With EKG changes and new onset chest pressure, will order troponin with reflex to trend.   Darci Current, DO 10/18/2022, 10:39 AM PGY-1, [facility] pager [phone]

## 2022-10-18 NOTE — Hospital Course (Signed)
Travis Deleon is a 62 y.o. male that presented with dyspnea found to have acute CHF exacerbation. PMH significant for Prostatic cancer, HLD, T2DM, solitary kidney, CHF. His hospital course is outlined below:    Acute exacerbation of CHF:  Patient presented to the ED with leg swelling and orthopnea. BNP resulted at 503. Appeared volume overloaded. Diuresed with IV Lasix '40mg'$  x3 and Lasix '40mg'$  PO with around 4L UOP. He was started on daily Torsemide '40mg'$  PO and Kcl 52mq given persistent hypokalemia. At the time of discharge, patient with normal WOB on RA and euvolemic.   PCP Follow Up:  Patient discharged on Torsemide '40mg'$  PO and Kcl 233m, follow up BMP and volume status.  [place], repleted prior to discharge.  Held lisinopril given initial normal BP, recheck BP at follow up and adjust as appropriate Patient wishes to discuss cardiac clearance for urologic procedure next week

## 2022-10-18 NOTE — Progress Notes (Signed)
Patient expressed to RN that he felt short of breathe ambulating from the bathroom back to bed. Patient has no prn breathing treatment. RN paged and notified MD. MD ordered stat CXR, one time dose of IV lasix and EKG. EKG obtained.

## 2022-10-18 NOTE — Progress Notes (Addendum)
FMTS Brief Progress Note  S:Patient seen sitting up in bed with Dr. Joelyn Oms. Patient denies any chest pain. Reports feeling like his SOB has improved with additional dose of lasix.   O: BP (!) 145/79 (BP Location: Left Arm)   Pulse 70   Temp 98.3 F (36.8 C) (Oral)   Resp 18   Ht '5\' 9"'$  (1.753 m)   Wt 280 lb 12.8 oz (127.4 kg)   SpO2 96%   BMI 41.47 kg/m   GEN: well-appearing, in no acute distress CARD: regular rate and rhythm. No m/r/g. PULM: clear, no wheezing or crackles. No increased WOB noted. EXT: +2-3 pitting edema   A/P: Acute CHF Exacerbation SOB improved since earlier this afternoon. CXR with no significant interval change. EKG with PVCs. Troponin trend stable. Significant edema still present in BLE. Discussed with patient giving additional dose of lasix for potential d/c tomorrow and patient agreeable.  -additional dose of lasix '40mg'$  PO NOW  -strict I&O with fluid restriction diet -rest of management per day team.   - Orders reviewed. Labs for AM ordered, which was adjusted as needed.   Rolanda Lundborg, MD 10/18/2022, 8:33 PM PGY-1, [facility] Night Resident  Please page [phone] with questions.

## 2022-10-18 NOTE — Plan of Care (Signed)

## 2022-10-18 NOTE — Assessment & Plan Note (Deleted)
Mg appropriate today. -AM Mag level

## 2022-10-18 NOTE — Progress Notes (Signed)
     Daily Progress Note Intern Pager: [phone]  Patient name: Travis Deleon Medical record number: [id number] Date of birth: [birth year] Age: 62 y.o. Gender: male  Primary Care Provider: [name], MD Consultants: None Code Status: Full  Pt Overview and Major Events to Date:  3/2 - Admitted  Assessment and Plan: Travis Deleon is a 62 y.o. male that presented with dyspnea found to have acute CHF exacerbation. PMH significant for Prostatic cancer, HLD, T2DM, solitary kidney, CHF.  * Acute exacerbation of CHF (congestive heart failure) (HCC) UOP of >2L (net  1.5L), weight down about 4-5lb per chart review. Adjusting patient's home medications to align with GDMT. Currently blood pressures are normotensive but lower than expected, will continue to hold Lisinopril for now. - Re-dose IV Lasix '40mg'$  - Strict Is&Os - Daily Weights - Spironolactone '25mg'$  daily (do not have home Eplerenone on formulary) - Carvedilol '25mg'$  BID - Holding home Lisinopril   Hypomagnesemia Mag 1.6, being actively diuresed.  - Replete with 2g - Trend with mag level in AM  Hypokalemia Potassium 3.1, actively being diuresed. - Replete with 39mq x2 doses - Trend with AM BMP  Diabetes mellitus type 2 in obese (HCC) Awaiting A1c result. Home dose insulin 90 units daily of Tresiba (last taken on 3/2). - Hold Metformin and Glipizide - Semglee 45 Units daily, titrate up  - Moderate SSI - CBGs QAC and QHS  Solitary kidney, acquired S/p nephrectomy for renal calculi. Cr today 1.17, improved from admission 1.33 - Continue to trend with Cr    FEN/GI: Heart healthy/carb modified PPx: Lovenox subQ Dispo:Home pending clinical improvement . Barriers include continued diuresis.   Subjective:  Patient reports improvement in his breathing and leg swelling, feels like he has been urinating well through the night. Denies any chest pain or palpitations. Has dry cough without any other viral or systemic symptoms at  this time.   Objective: Temp:  [98.2 F (36.8 C)-99.4 F (37.4 C)] 98.2 F (36.8 C) (03/03 0424) Pulse Rate:  [63-74] 74 (03/03 0424) Resp:  [15-30] 18 (03/03 0424) BP: (102-163)/(63-107) 102/88 (03/03 0424) SpO2:  [90 %-98 %] 90 % (03/03 0424) Weight:  [127.4 kg-129.3 kg] 127.4 kg (03/03 0424) Physical Exam: General: NAD, in right lateral recumbent position, easily awoken from sleep Cardiovascular: RRR, no murmur appreciated Respiratory: breathing comfortably on room air, CTAB Abdomen: soft, non-tender, non-distended Extremities: 1+ edema in BLE  Laboratory: Most recent CBC Lab Results  Component Value Date   WBC 7.4 10/17/2022   HGB 14.1 10/17/2022   HCT 43.9 10/17/2022   MCV 84.3 10/17/2022   PLT 381 10/17/2022   Most recent BMP    Latest Ref Rng & Units 10/18/2022   12:51 AM  BMP  Glucose 70 - 99 mg/dL 113   BUN 6 - 20 mg/dL 9   Creatinine 0.61 - 1.24 mg/dL 1.17   Sodium 135 - 145 mmol/L 138   Potassium 3.5 - 5.1 mmol/L 3.1   Chloride 98 - 111 mmol/L 104   CO2 22 - 32 mmol/L 25   Calcium 8.9 - 10.3 mg/dL 8.5     Other pertinent labs  - Cholesterol 98, HLD 29, LDL 50 - TSH 0.622    Travis Jiminez, DO 10/18/2022, 6:12 AM  PGY-3, [place] pager: [phone] text pages welcome Secure chat group [facility]

## 2022-10-18 NOTE — Progress Notes (Signed)
Patient requested a hospitalization note to be excuse for a court appearance. RN paged MD. MD sent letter to chart, RN gave to patient's wife.

## 2022-10-18 NOTE — Assessment & Plan Note (Addendum)
Potassium 3.2, repleted. Mg appropriate today. Likely in the setting of diuresis. -Trend with AM BMP and Mag

## 2022-10-19 DIAGNOSIS — I5021 Acute systolic (congestive) heart failure: Secondary | ICD-10-CM

## 2022-10-19 DIAGNOSIS — E8770 Fluid overload, unspecified: Secondary | ICD-10-CM | POA: Diagnosis not present

## 2022-10-19 LAB — BASIC METABOLIC PANEL
Anion gap: 10 (ref 5–15)
BUN: 15 mg/dL (ref 6–20)
CO2: 27 mmol/L (ref 22–32)
Calcium: 8.8 mg/dL — ABNORMAL LOW (ref 8.9–10.3)
Chloride: 101 mmol/L (ref 98–111)
Creatinine, Ser: 1.25 mg/dL — ABNORMAL HIGH (ref 0.61–1.24)
GFR, Estimated: >60 mL/min (ref >60)
Glucose, Bld: 228 mg/dL — ABNORMAL HIGH (ref 70–99)
Potassium: 3.2 mmol/L — ABNORMAL LOW (ref 3.5–5.1)
Sodium: 138 mmol/L (ref 135–145)

## 2022-10-19 LAB — HEMOGLOBIN A1C
Hgb A1c MFr Bld: 8.1 % — ABNORMAL HIGH (ref 4.8–5.6)
Mean Plasma Glucose: 186 mg/dL

## 2022-10-19 LAB — GLUCOSE, CAPILLARY: Glucose-Capillary: 172 mg/dL — ABNORMAL HIGH (ref 70–99)

## 2022-10-19 LAB — MAGNESIUM: Magnesium: 1.9 mg/dL (ref 1.7–2.4)

## 2022-10-19 MED ORDER — POTASSIUM CHLORIDE CRYS ER 20 MEQ PO TBCR: 20.0000 meq | EXTENDED_RELEASE_TABLET | Freq: Every day | ORAL | 0 refills | Status: AC

## 2022-10-19 MED ORDER — ENOXAPARIN SODIUM 60 MG/0.6ML IJ SOSY: 60.0000 mg | PREFILLED_SYRINGE | INTRAMUSCULAR | Status: DC

## 2022-10-19 MED ORDER — POTASSIUM CHLORIDE CRYS ER 20 MEQ PO TBCR
40.0000 meq | EXTENDED_RELEASE_TABLET | ORAL | Status: AC
  Administered 2022-10-19 (×2): 40 meq via ORAL
  Filled 2022-10-19 (×2): qty 2

## 2022-10-19 MED ORDER — MAGNESIUM SULFATE 2 GM/50ML IV SOLN
2.0000 g | Freq: Once | INTRAVENOUS | Status: AC
  Administered 2022-10-19: 2 g via INTRAVENOUS
  Filled 2022-10-19: qty 50

## 2022-10-19 MED ORDER — TORSEMIDE 20 MG PO TABS: 40.0000 mg | ORAL_TABLET | Freq: Every day | ORAL | 0 refills | Status: AC

## 2022-10-19 MED ORDER — CARVEDILOL 25 MG PO TABS: 25.0000 mg | ORAL_TABLET | Freq: Two times a day (BID) | ORAL | 1 refills | Status: DC

## 2022-10-19 MED ORDER — SPIRONOLACTONE 25 MG PO TABS: 25.0000 mg | ORAL_TABLET | Freq: Every day | ORAL | 1 refills | Status: AC

## 2022-10-19 NOTE — Plan of Care (Signed)

## 2022-10-19 NOTE — Progress Notes (Signed)
Mobility Specialist Progress Note:   10/19/22 1040  Mobility  Activity Ambulated independently in hallway  Level of Assistance Independent  Assistive Device None  Distance Ambulated (ft) 500 ft  Activity Response Tolerated well  Mobility Referral Yes  $Mobility charge 1 Mobility   Pt eager for mobility session. No physical assistance required. Denies SOB/fatigue with exertion. Pt left in chair with all needs met, wife in room.  Nelta Numbers Mobility Specialist Please contact via SecureChat or  Rehab office at [phone]

## 2022-10-19 NOTE — Discharge Instructions (Signed)
Dear Travis Deleon,   Thank you for letting us participate in your care! In this section, you will find a brief hospital admission summary of why you were admitted to the hospital, what happened during your admission, your diagnosis/diagnoses, and recommended follow up.  Primary diagnosis: Heart failure exacerbation Treatment plan: You were treated with diuretics to help removes the excess fluid from your body. Please continue to take the Lasix daily and follow up with your PCP.   POST-HOSPITAL & CARE INSTRUCTIONS We recommend following up with your PCP within 1 week from being discharged from the hospital. Please let PCP/Specialists know of any changes in medications that were made which you will be able to see in the medications section of this packet.  DOCTOR'S APPOINTMENTS & FOLLOW UP No future appointments.   Thank you for choosing [facility]! Take care and be well!  [facility]  [place], [place] [address] [phone]

## 2022-10-19 NOTE — TOC Transition Note (Signed)
Transition of Care [facility]) - CM/SW Discharge Note   Patient Details  Name: Travis Deleon MRN: [id number] Date of Birth: 1961-03-13  [facility] CM/SW Contact:  Zenon Mayo, RN Phone Number: 10/19/2022, 10:27 AM   Clinical Narrative:    Patient is for dc today, has no needs.         Patient Goals and CMS Choice      Discharge Placement                         Discharge Plan and Services Additional resources added to the After Visit Summary for                                       Social Determinants of Health (SDOH) Interventions SDOH Screenings   Food Insecurity: No Food Insecurity (10/17/2022)  Housing: Low Risk  (10/17/2022)  Transportation Needs: No Transportation Needs (10/17/2022)  Utilities: Not At Risk (10/17/2022)  Tobacco Use: Low Risk  (10/17/2022)     Readmission Risk Interventions     No data to display

## 2022-10-19 NOTE — Progress Notes (Addendum)
Explained discharge instructions to patient. Reviewed follow up appointment and next medication administration times. Also reviewed education. Patient verbalized having an understanding for instructions given. All belongings are in the patient's possession.  IV and telemetry were removed. CCMD was notified.

## 2022-10-19 NOTE — Discharge Summary (Signed)
[facility] Discharge Summary  Patient name: Travis Deleon Medical record number: [id number] Date of birth: [birth year] Age: 62 y.o. Gender: male Date of Admission: 10/17/2022  Date of Discharge: 10/19/2022 Admitting Physician: Salvadore Oxford, MD  Primary Care Provider: [name], MD Consultants: None  Indication for Hospitalization: HF exacerbation  Discharge Diagnoses/Problem List:  Principal Problem for Admission: HF exacerbation Other Problems addressed during stay:  Principal Problem:   Acute exacerbation of CHF (congestive heart failure) ([place]) Active Problems:   Solitary kidney, acquired   Diabetes mellitus type 2 in obese [facility])   Electrolyte abnormality   Type 2 diabetes mellitus without complication, with long-term current use of insulin (HCC)   Diastolic congestive heart failure (HCC)   Hypervolemia   Acute systolic congestive heart failure [facility])   Brief Hospital Course:  Travis Deleon is a 62 y.o. male that presented with dyspnea found to have acute CHF exacerbation. PMH significant for Prostatic cancer, HLD, T2DM, solitary kidney, CHF. His hospital course is outlined below:    Acute exacerbation of CHF:  Patient presented to the ED with leg swelling and orthopnea. BNP resulted at 503. Appeared volume overloaded. Diuresed with IV Lasix '40mg'$  x3 and Lasix '40mg'$  PO with around 4L UOP. He was started on daily Torsemide '40mg'$  PO and Kcl 28mq given persistent hypokalemia. At the time of discharge, patient with normal WOB on RA and euvolemic.   PCP Follow Up:  Patient discharged on Torsemide '40mg'$  PO and Kcl 2100m, follow up BMP and volume status.  [facility], repleted prior to discharge.  Held lisinopril given initial normal BP, recheck BP at follow up and adjust as appropriate Patient wishes to discuss cardiac clearance for urologic procedure next week   Disposition: Home  Discharge Condition: Stable  Discharge Exam:   Vitals:   10/19/22 0424 10/19/22 0729  BP: (!) 156/98 (!) 164/74  Pulse: 82 84  Resp: 18 16  Temp: 98.4 F (36.9 C) (!) 97.3 F (36.3 C)  SpO2: 92% 100%   General: NAD, Alert. Sitting up in bed comfortably Cardiovascular: RRR, no murmur appreciated Respiratory: Normal WOB on RA, CTAB Abdomen: soft, non-tender, non-distended Extremities: 1+ edema in BLE  Significant Procedures: None  Significant Labs and Imaging:  No results for input(s): "WBC", "HGB", "HCT", "PLT" in the last 48 hours. Recent Labs  Lab 10/17/22 1742 10/18/22 0051 10/19/22 0043  NA 138 138 138  K 3.2* 3.1* 3.2*  CL 102 104 101  CO2 '26 25 27  '$ GLUCOSE 136* 113* 228*  BUN '10 9 15  '$ CREATININE 1.14 1.17 1.25*  CALCIUM 8.9 8.5* 8.8*  MG  --  1.6* 1.9    Pertinent Imaging: DG CHEST PORT 1 VIEW Result Date: 10/18/2022 IMPRESSION: No significant interval change when adjusting for technique. Question slight left lung base opacity  DG Chest Port 1 View Result Date: 10/17/2022 IMPRESSION: Low volume chest with cardiomegaly and vascular congestion.   Results/Tests Pending at Time of Discharge: None  Discharge Medications:  Allergies as of 10/19/2022       Reactions   Aspirin Hives, Rash        Medication List     STOP taking these medications    atenolol 100 MG tablet Commonly known as: TENORMIN   eplerenone 50 MG tablet Commonly known as: INSPRA Replaced by: spironolactone 25 MG tablet   guaiFENesin 600 MG 12 hr tablet Commonly known as: MUCINEX   magnesium oxide 400 (240 Mg) MG tablet Commonly known  as: MAG-OX   Ozempic (0.25 or 0.5 MG/DOSE) 2 MG/3ML Sopn Generic drug: Semaglutide(0.25 or 0.'5MG'$ /DOS)       TAKE these medications    acetaminophen 500 MG tablet Commonly known as: TYLENOL Take 1,000 mg by mouth as needed for moderate pain.   amLODipine 10 MG tablet Commonly known as: NORVASC Take 10 mg by mouth every evening.   carvedilol 25 MG tablet Commonly known as:  COREG Take 1 tablet (25 mg total) by mouth 2 (two) times daily with a meal.   glipiZIDE 10 MG tablet Commonly known as: GLUCOTROL Take 20 mg by mouth daily before breakfast.   insulin degludec 200 UNIT/ML FlexTouch Pen Commonly known as: TRESIBA Inject 90 Units into the skin daily.   lisinopril 40 MG tablet Commonly known as: ZESTRIL Take 40 mg by mouth daily.   metFORMIN 1000 MG tablet Commonly known as: GLUCOPHAGE Take 1,000 mg by mouth 2 (two) times daily with a meal.   potassium chloride SA 20 MEQ tablet Commonly known as: KLOR-CON M Take 1 tablet (20 mEq total) by mouth daily.   rosuvastatin 10 MG tablet Commonly known as: CRESTOR Take 10 mg by mouth every evening.   spironolactone 25 MG tablet Commonly known as: ALDACTONE Take 1 tablet (25 mg total) by mouth daily. Replaces: eplerenone 50 MG tablet   torsemide 20 MG tablet Commonly known as: DEMADEX Take 2 tablets (40 mg total) by mouth daily.        Discharge Instructions: Please refer to Patient Instructions section of EMR for full details.  Patient was counseled important signs and symptoms that should prompt return to medical care, changes in medications, dietary instructions, activity restrictions, and follow up appointments.   Follow-Up Appointments:  Follow-up Information     [name], MD. Schedule an appointment as soon as possible for a visit.   Specialty: Family Medicine Why: Please keep your appointment 3/5 for follow up Contact information: [address] [place] [address] [phone]                 Colletta Maryland, MD 10/19/2022, 12:24 PM PGY-1, [place]

## 2022-10-19 NOTE — Progress Notes (Addendum)
     Daily Progress Note Intern Pager: [phone]  Patient name: Travis Deleon Medical record number: [id number] Date of birth: 03-Nov-1960 Age: 62 y.o. Gender: male  Primary Care Provider: [name], MD Consultants: None Code Status: Full   Pt Overview and Major Events to Date:  3/2: Admitted to FMTS   Assessment and Plan: [name] is a 62 y.o. male that presented with dyspnea found to have acute CHF exacerbation. PMH significant for Prostatic cancer, HLD, T2DM, solitary kidney, CHF.  * Acute exacerbation of CHF (congestive heart failure) (HCC) Sufficient diuresis with 4L UOP yesterday and weight continues to trend down. - Strict Is&Os & daily weights - Spironolactone '25mg'$  daily (do not have home Eplerenone on formulary) - Carvedilol '25mg'$  BID - Consider adding back Lisinopril outpatient  Electrolyte abnormality Potassium 3.2, repleted. Mg appropriate today. Likely in the setting of diuresis. -Trend with AM BMP and Mag  Diabetes mellitus type 2 in obese (Travis Deleon) BGL 170-334. Home dose insulin 90 units daily of Tresiba (last taken on 3/2). -Hold home Metformin and Glipizide -Semglee 45 Units daily, titrate up to 50U daily -mSSI -Insulin qhs coverage  Solitary kidney, acquired Cr mildly increased to 1.25 (baseline ~1.3) -Monitor Cr given aggressive diuresis    FEN/GI: Heart healthy/carb modified PPx: Lovenox subQ Dispo:Home pending clinical improvement . Barriers include continued diuresis.   Subjective:  Patient assessed bedside, sitting up in bed comfortably. Expressed frustration that he might not be discharged today but states he is going to go regardless. States he feels much better and has to get back to work. Denies SOB and states his legs are much less swollen than they were.  Objective: Temp:  [97.3 F (36.3 C)-98.4 F (36.9 C)] 97.3 F (36.3 C) (03/04 0729) Pulse Rate:  [65-84] 84 (03/04 0729) Resp:  [16-18] 16 (03/04 0729) BP: (144-164)/(72-98) 164/74  (03/04 0729) SpO2:  [92 %-100 %] 100 % (03/04 0729) Weight:  [125.8 kg] 125.8 kg (03/04 0010) Physical Exam: General: NAD, Alert. Sitting up in bed comfortably Cardiovascular: RRR, no murmur appreciated Respiratory: Normal WOB on RA, CTAB Abdomen: soft, non-tender, non-distended Extremities: 1+ edema in BLE  Laboratory: Most recent CBC Lab Results  Component Value Date   WBC 7.4 10/17/2022   HGB 14.1 10/17/2022   HCT 43.9 10/17/2022   MCV 84.3 10/17/2022   PLT 381 10/17/2022   Most recent BMP    Latest Ref Rng & Units 10/19/2022   12:43 AM  BMP  Glucose 70 - 99 mg/dL 228   BUN 6 - 20 mg/dL 15   Creatinine 0.61 - 1.24 mg/dL 1.25   Sodium 135 - 145 mmol/L 138   Potassium 3.5 - 5.1 mmol/L 3.2   Chloride 98 - 111 mmol/L 101   CO2 22 - 32 mmol/L 27   Calcium 8.9 - 10.3 mg/dL 8.8     Other pertinent labs: Glu: 172  Imaging/Diagnostic Tests: DG CHEST PORT 1 VIEW Result Date: 10/18/2022 IMPRESSION: No significant interval change when adjusting for technique. Question slight left lung base opacity    Colletta Maryland, MD 10/19/2022, 10:16 AM  PGY-1, [place] Intern pager: [phone], text pages welcome Secure chat group [place]

## 2022-10-19 NOTE — Progress Notes (Signed)
Patient is wanting to speak with the doctor prior to discharging.

## 2022-10-19 NOTE — TOC Transition Note (Signed)
Transition of Care [facility]) - CM/SW Discharge Note   Patient Details  Name: Travis Deleon MRN: [id number] Date of Birth: 1961/01/22  Transition of Care [facility]) CM/SW Contact:  Zenon Mayo, RN Phone Number: 10/19/2022, 10:39 AM   Clinical Narrative:    Patient is for dc today,  NCM asked patient about transportation, his wife is in the room and she wants to speak to the doctor, she wants to ask about his discharge. NCM notified doctor of this information.         Patient Goals and CMS Choice      Discharge Placement                         Discharge Plan and Services Additional resources added to the After Visit Summary for                                       Social Determinants of Health (SDOH) Interventions SDOH Screenings   Food Insecurity: No Food Insecurity (10/17/2022)  Housing: Low Risk  (10/17/2022)  Transportation Needs: No Transportation Needs (10/17/2022)  Utilities: Not At Risk (10/17/2022)  Tobacco Use: Low Risk  (10/17/2022)     Readmission Risk Interventions     No data to display

## 2022-10-19 NOTE — Inpatient Diabetes Management (Signed)
Inpatient Diabetes Program Recommendations  AACE/ADA: New Consensus Statement on Inpatient Glycemic Control (2015)  Target Ranges:  Prepandial:   less than 140 mg/dL      Peak postprandial:   less than 180 mg/dL (1-2 hours)      Critically ill patients:  140 - 180 mg/dL   Lab Results  Component Value Date   GLUCAP 172 (H) 10/19/2022   HGBA1C 11.3 (H) 08/13/2022    Review of Glycemic Control  Latest Reference Range & Units 10/17/22 17:05 10/17/22 21:49 10/18/22 06:02 10/18/22 11:28 10/18/22 15:48 10/18/22 21:11 10/19/22 06:00  Glucose-Capillary 70 - 99 mg/dL 123 (H) 145 (H) 115 (H) 204 (H) 180 (H) 334 (H) 172 (H)   Diabetes history: DM  Outpatient Diabetes medications:  Glucotrol 20 mg daily Tresiba 90 units daily Metformin 1000 mg bid Current orders for Inpatient glycemic control:  Novolog 0-15 units with meals Semglee 45 units daily   Inpatient Diabetes Program Recommendations:    While in the hospital, consider adding Novolog 4 unit tid with meals (hold if patient eats less than 50% or NPO).    Thanks,  Adah Perl, RN, BC-ADM Inpatient Diabetes Coordinator Pager [phone]  (8a-5p)

## 2022-10-21 ENCOUNTER — Encounter: Payer: Self-pay | Admitting: Internal Medicine

## 2022-10-21 ENCOUNTER — Ambulatory Visit: Payer: Managed Care, Other (non HMO) | Attending: Internal Medicine | Admitting: Internal Medicine

## 2022-10-21 VITALS — BP 130/68 | HR 60 | Ht 69.0 in | Wt 280.0 lb

## 2022-10-21 DIAGNOSIS — E1159 Type 2 diabetes mellitus with other circulatory complications: Secondary | ICD-10-CM | POA: Diagnosis not present

## 2022-10-21 DIAGNOSIS — I5032 Chronic diastolic (congestive) heart failure: Secondary | ICD-10-CM

## 2022-10-21 DIAGNOSIS — Z794 Long term (current) use of insulin: Secondary | ICD-10-CM

## 2022-10-21 DIAGNOSIS — E1169 Type 2 diabetes mellitus with other specified complication: Secondary | ICD-10-CM

## 2022-10-21 DIAGNOSIS — E119 Type 2 diabetes mellitus without complications: Secondary | ICD-10-CM | POA: Diagnosis not present

## 2022-10-21 DIAGNOSIS — Z01810 Encounter for preprocedural cardiovascular examination: Secondary | ICD-10-CM

## 2022-10-21 DIAGNOSIS — I152 Hypertension secondary to endocrine disorders: Secondary | ICD-10-CM

## 2022-10-21 DIAGNOSIS — Z6841 Body Mass Index (BMI) 40.0 and over, adult: Secondary | ICD-10-CM

## 2022-10-21 DIAGNOSIS — E785 Hyperlipidemia, unspecified: Secondary | ICD-10-CM

## 2022-10-21 MED ORDER — CLOPIDOGREL BISULFATE 75 MG PO TABS: 75.0000 mg | ORAL_TABLET | Freq: Every day | ORAL | 3 refills | Status: DC

## 2022-10-21 MED ORDER — LOSARTAN POTASSIUM 25 MG PO TABS: 25.0000 mg | ORAL_TABLET | Freq: Every day | ORAL | 3 refills | Status: DC

## 2022-10-21 MED ORDER — EMPAGLIFLOZIN 10 MG PO TABS: 10.0000 mg | ORAL_TABLET | Freq: Every day | ORAL | 5 refills | Status: DC

## 2022-10-21 NOTE — Patient Instructions (Signed)
Medication Instructions:  STOP lisinopril START Plavix '75mg'$  AFTER urology procedure START Jardiance '10mg'$  START Losartan '25mg'$  as directed *If you need a refill on your cardiac medications before your next appointment, please call your pharmacy*   Lab Work: Lp(a) TODAY If you have labs (blood work) drawn today and your tests are completely normal, you will receive your results only by: [place] (if you have MyChart) OR A paper copy in the mail If you have any lab test that is abnormal or we need to change your treatment, we will call you to review the results.   Testing/Procedures: None    Follow-Up: At [facility], you and your health needs are our priority.  As part of our continuing mission to provide you with exceptional heart care, we have created designated Provider Care Teams.  These Care Teams include your primary Cardiologist (physician) and Advanced Practice Providers (APPs -  Physician Assistants and Nurse Practitioners) who all work together to provide you with the care you need, when you need it.  We recommend signing up for the patient portal called "MyChart".  Sign up information is provided on this After Visit Summary.  MyChart is used to connect with patients for Virtual Visits (Telemedicine).  Patients are able to view lab/test results, encounter notes, upcoming appointments, etc.  Non-urgent messages can be sent to your provider as well.   To learn more about what you can do with MyChart, go to [email or web address].    Your next appointment:   6 month(s)  Provider:   Melina Copa, PA-C, Ambrose Pancoast, NP, Ermalinda Barrios, PA-C, Christen Bame, NP, or Richardson Dopp, PA-C

## 2022-10-21 NOTE — Progress Notes (Signed)
Cardiology Office Note:    Date:  10/21/2022   ID:  Travis Deleon, DOB January 28, 1961, MRN [id number]  PCP:  [name], MD   [facility] Prov[facility] Cardiologist:  Lenna Sciara, MD Referring MD: [name], MD   Chief Complaint/Reason for Referral: Diastolic heart failure  ASSESSMENT:    1. Chronic diastolic congestive heart failure ([place])   2. Type 2 diabetes mellitus without complication, with long-term current use of insulin ([name])   3. Hypertension associated with diabetes ([name])   4. Hyperlipidemia associated with type 2 diabetes mellitus ([name])   5. BMI 40.0-44.9, adult ([name])   6. Preoperative cardiovascular examination     PLAN:    In order of problems listed above: 1.  Chronic diastolic heart failure: Start Jardiance 10 mg daily.  Change lisinopril to losartan 25 mg.  Continue Coreg and spironolactone.  Continue torsemide. 2.  Type 2 diabetes: Start plavix daily in lieu of aspirin (after your surgery), continue Crestor, start Jardiance, and losartan as above. 3.  Hypertension: Discontinue lisinopril and start losartan (can consider Entresto later); continue amlodipine.  BP is well controlled. 4.  Hyperlipidemia: LDL was at goal recently.  Check LP(a) today. 5.  Elevated BMI: On Ozempic. 6.  Preoperative cardiovascular assessment: The patient is to undergo implantation of seeds for brachytherapy for his prostate cancer.  He can do 4 METS of activity and has no exertional angina or exertional dyspnea.  He is relatively euvolemic on exam today.  He is at low risk for perioperative cardiovascular complication.             Dispo:  Return in about 6 months (around 04/23/2023).      Medication Adjustments/Labs and Tests Ordered: Current medicines are reviewed at length with the patient today.  Concerns regarding medicines are outlined above.  The following changes have been made:     Labs/tests ordered: Orders Placed This Encounter  Procedures   Lipoprotein A  (LPA)    Medication Changes: Meds ordered this encounter  Medications   clopidogrel (PLAVIX) 75 MG tablet    Sig: Take 1 tablet (75 mg total) by mouth daily.    Dispense:  90 tablet    Refill:  3   empagliflozin (JARDIANCE) 10 MG TABS tablet    Sig: Take 1 tablet (10 mg total) by mouth daily before breakfast.    Dispense:  30 tablet    Refill:  5   losartan (COZAAR) 25 MG tablet    Sig: Take 1 tablet (25 mg total) by mouth daily.    Dispense:  90 tablet    Refill:  3    Stop lisinopril     Current medicines are reviewed at length with the patient today.  The patient does not have concerns regarding medicines.   History of Present Illness:    FOCUSED PROBLEM LIST:   1.  Type 2 diabetes on insulin 2.  Hypertension 3.  Hyperlipidemia 4.  BMI of 40 5.  Prostate cancer followed by radiation oncology 6.  Solitary kidney 7.  ASA allergy  The patient is a 62 y.o. male with the indicated medical history here for hospital follow-up.  The patient was admitted recently with acute on chronic diastolic heart failure exacerbation.  BNP was found to be elevated.  His troponins were mildly elevated with no delta.  He was diuresed and discharged on a regimen including spironolactone, carvedilol, lisinopril, and torsemide.  Prior to being admitted to the hospital the patient had contracted  pneumonia.  He had his hydrochlorothiazide stopped around that time as well.  He noticed increasing shortness of breath with the pneumonia.  Following this he then developed orthopnea, paroxysmal nocturnal dyspnea, and dyspnea on exertion.  Prior to this he had no issues with angina, dyspnea, paroxysmal atrial dyspnea, orthopnea.  Right now he feels very well and denies any cardiovascular symptoms besides from some mild peripheral edema.  He is to undergo implantation of radioactive seeds for treatment of his prostate cancer relatively soon.        Current Medications: Current Meds  Medication Sig    acetaminophen (TYLENOL) 500 MG tablet Take 1,000 mg by mouth as needed for moderate pain.   amLODipine (NORVASC) 10 MG tablet Take 10 mg by mouth every evening.   carvedilol (COREG) 25 MG tablet Take 1 tablet (25 mg total) by mouth 2 (two) times daily with a meal.   clopidogrel (PLAVIX) 75 MG tablet Take 1 tablet (75 mg total) by mouth daily.   empagliflozin (JARDIANCE) 10 MG TABS tablet Take 1 tablet (10 mg total) by mouth daily before breakfast.   glipiZIDE (GLUCOTROL) 10 MG tablet Take 20 mg by mouth daily before breakfast.   insulin degludec (TRESIBA) 200 UNIT/ML FlexTouch Pen Inject 90 Units into the skin daily.   losartan (COZAAR) 25 MG tablet Take 1 tablet (25 mg total) by mouth daily.   metFORMIN (GLUCOPHAGE) 1000 MG tablet Take 1,000 mg by mouth 2 (two) times daily with a meal.   potassium chloride SA (KLOR-CON M) 20 MEQ tablet Take 1 tablet (20 mEq total) by mouth daily.   rosuvastatin (CRESTOR) 10 MG tablet Take 10 mg by mouth every evening.   Semaglutide,0.25 or 0.'5MG'$ /DOS, (OZEMPIC, 0.25 OR 0.5 MG/DOSE,) 2 MG/1.5ML SOPN Inject 0.25 mg into the skin once a week.   spironolactone (ALDACTONE) 25 MG tablet Take 1 tablet (25 mg total) by mouth daily.   torsemide (DEMADEX) 20 MG tablet Take 2 tablets (40 mg total) by mouth daily.   [DISCONTINUED] lisinopril (ZESTRIL) 40 MG tablet Take 40 mg by mouth daily.     Allergies:    Aspirin   Social History:   Social History   Tobacco Use   Smoking status: Never   Smokeless tobacco: Never  Substance Use Topics   Alcohol use: Not Currently   Drug use: Not Currently     Family Hx: History reviewed. No pertinent family history.   Review of Systems:   Please see the history of present illness.    All other systems reviewed and are negative.     EKGs/Labs/Other Test Reviewed:    EKG:  EKG performed March 2023 that I personally reviewed demonstrates sinus rhythm with PVCs and nonspecific ST and T wave changes.  Prior CV  studies:  Cardiac Studies & Procedures       ECHOCARDIOGRAM  ECHOCARDIOGRAM COMPLETE 08/14/2022  Narrative ECHOCARDIOGRAM REPORT    Patient Name:   Travis Deleon Date of Exam: 08/14/2022 Medical Rec #:  [id number]   Height:       69.0 in Accession #:    [id number]  Weight:       227.0 lb Date of Birth:  Oct 16, 1960    BSA:          2.180 m Patient Age:    26 years    BP:           109/61 mmHg Patient Gender: M           HR:  62 bpm. Exam Location:  Inpatient  Procedure: 2D Echo, Color Doppler, Cardiac Doppler and Intracardiac Opacification Agent  Indications:    Atrial Fibrillation I48.91  History:        Patient has no prior history of Echocardiogram examinations. Risk Factors:Hypertension, Diabetes and Dyslipidemia.  Sonographer:    Ronny Flurry Referring Phys: [id number] BELKYS A REGALADO  IMPRESSIONS   1. Left ventricular ejection fraction, by estimation, is 60 to 65%. The left ventricle has normal function. The left ventricle has no regional wall motion abnormalities. There is mild concentric left ventricular hypertrophy. Left ventricular diastolic parameters are consistent with Grade II diastolic dysfunction (pseudonormalization). Elevated left ventricular end-diastolic pressure. 2. Right ventricular systolic function is normal. The right ventricular size is normal. There is normal pulmonary artery systolic pressure. 3. Left atrial size was mildly dilated. 4. The mitral valve is normal in structure. Mild mitral valve regurgitation. No evidence of mitral stenosis. 5. The aortic valve is tricuspid. Aortic valve regurgitation is not visualized. No aortic stenosis is present. 6. Aortic dilatation noted. There is mild dilatation of the aortic root. 7. The inferior vena cava is dilated in size with <50% respiratory variability, suggesting right atrial pressure of 15 mmHg.  FINDINGS Left Ventricle: Left ventricular ejection fraction, by estimation, is 60 to 65%. The  left ventricle has normal function. The left ventricle has no regional wall motion abnormalities. Definity contrast agent was given IV to delineate the left ventricular endocardial borders. The left ventricular internal cavity size was normal in size. There is mild concentric left ventricular hypertrophy. Left ventricular diastolic parameters are consistent with Grade II diastolic dysfunction (pseudonormalization). Elevated left ventricular end-diastolic pressure.  Right Ventricle: The right ventricular size is normal. No increase in right ventricular wall thickness. Right ventricular systolic function is normal. There is normal pulmonary artery systolic pressure. The tricuspid regurgitant velocity is 1.12 m/s, and with an assumed right atrial pressure of 15 mmHg, the estimated right ventricular systolic pressure is 0000000 mmHg.  Left Atrium: Left atrial size was mildly dilated.  Right Atrium: Right atrial size was normal in size.  Pericardium: There is no evidence of pericardial effusion.  Mitral Valve: The mitral valve is normal in structure. Mild mitral valve regurgitation. No evidence of mitral valve stenosis.  Tricuspid Valve: The tricuspid valve is normal in structure. Tricuspid valve regurgitation is trivial. No evidence of tricuspid stenosis.  Aortic Valve: The aortic valve is tricuspid. Aortic valve regurgitation is not visualized. No aortic stenosis is present. Aortic valve mean gradient measures 3.0 mmHg. Aortic valve peak gradient measures 6.2 mmHg. Aortic valve area, by VTI measures 3.16 cm.  Pulmonic Valve: The pulmonic valve was normal in structure. Pulmonic valve regurgitation is not visualized. No evidence of pulmonic stenosis.  Aorta: Aortic dilatation noted. There is mild dilatation of the aortic root.  Venous: The inferior vena cava is dilated in size with less than 50% respiratory variability, suggesting right atrial pressure of 15 mmHg.  IAS/Shunts: No atrial level  shunt detected by color flow Doppler.   LEFT VENTRICLE PLAX 2D LVIDd:         5.30 cm   Diastology LVIDs:         3.90 cm   LV e' medial:    5.33 cm/s LV PW:         1.20 cm   LV E/e' medial:  20.8 LV IVS:        1.20 cm   LV e' lateral:   9.14 cm/s LVOT diam:  2.30 cm   LV E/e' lateral: 12.1 LV SV:         90 LV SV Index:   41 LVOT Area:     4.15 cm   RIGHT VENTRICLE             IVC RV S prime:     12.80 cm/s  IVC diam: 2.73 cm TAPSE (M-mode): 2.8 cm  LEFT ATRIUM             Index        RIGHT ATRIUM           Index LA diam:        4.20 cm 1.93 cm/m   RA Area:     14.90 cm LA Vol (A2C):   54.7 ml 25.09 ml/m  RA Volume:   37.70 ml  17.29 ml/m LA Vol (A4C):   92.1 ml 42.24 ml/m LA Biplane Vol: 75.1 ml 34.45 ml/m AORTIC VALVE AV Area (Vmax):    3.24 cm AV Area (Vmean):   3.11 cm AV Area (VTI):     3.16 cm AV Vmax:           125.00 cm/s AV Vmean:          86.300 cm/s AV VTI:            0.284 m AV Peak Grad:      6.2 mmHg AV Mean Grad:      3.0 mmHg LVOT Vmax:         97.50 cm/s LVOT Vmean:        64.567 cm/s LVOT VTI:          0.216 m LVOT/AV VTI ratio: 0.76  AORTA Ao Root diam: 3.70 cm Ao Asc diam:  3.50 cm  MITRAL VALVE                TRICUSPID VALVE MV Area (PHT): 4.49 cm     TR Peak grad:   5.0 mmHg MV Decel Time: 169 msec     TR Vmax:        112.00 cm/s MV E velocity: 111.00 cm/s MV A velocity: 50.10 cm/s   SHUNTS MV E/A ratio:  2.22         Systemic VTI:  0.22 m Systemic Diam: 2.30 cm  Skeet Latch MD Electronically signed by Skeet Latch MD Signature Date/Time: 08/14/2022/7:04:27 PM    Final             Other studies Reviewed: Review of the additional studies/records demonstrates: Review of imaging studies available demonstrates no aortic atherosclerosis or coronary artery calcification  Recent Labs: 10/17/2022: ALT 14; B Natriuretic Peptide 503.2; Hemoglobin 14.1; Platelets 381 10/18/2022: TSH 0.622 10/19/2022: BUN 15; Creatinine,  Ser 1.25; Magnesium 1.9; Potassium 3.2; Sodium 138   Recent Lipid Panel Lab Results  Component Value Date/Time   CHOL 98 10/18/2022 12:51 AM   TRIG 93 10/18/2022 12:51 AM   HDL 29 (L) 10/18/2022 12:51 AM   LDLCALC 50 10/18/2022 12:51 AM    Risk Assessment/Calculations:                Physical Exam:    VS:  BP 130/68   Pulse 60   Ht '5\' 9"'$  (1.753 m)   Wt 280 lb (127 kg)   SpO2 98%   BMI 41.35 kg/m    Wt Readings from Last 3 Encounters:  10/21/22 280 lb (127 kg)  10/19/22 277 lb 4.8 oz (125.8 kg)  07/23/22 227 lb (103  kg)    GENERAL:  No apparent distress, AOx3 HEENT:  No carotid bruits, +2 carotid impulses, no scleral icterus CAR: RRR no murmurs, gallops, rubs, or thrills RES:  Clear to auscultation bilaterally ABD:  Soft, nontender, nondistended, positive bowel sounds x 4 VASC:  +2 radial pulses, +2 carotid pulses, palpable pedal pulses NEURO:  CN 2-12 grossly intact; motor and sensory grossly intact PSYCH:  No active depression or anxiety EXT:  Mild edema, without ecchymosis, or cyanosis  Signed, Early Osmond, MD  10/21/2022 9:55 AM    [address], [place], [place]  [address] Phone: [phone]; Fax: [phone]   Note:  This document was prepared using Dragon voice recognition software and may include unintentional dictation errors.

## 2022-10-23 LAB — LIPOPROTEIN A (LPA): Lipoprotein (a): 142.9 nmol/L — ABNORMAL HIGH (ref <75.0)

## 2022-10-28 ENCOUNTER — Telehealth: Payer: Self-pay | Admitting: *Deleted

## 2022-10-28 ENCOUNTER — Encounter (HOSPITAL_BASED_OUTPATIENT_CLINIC_OR_DEPARTMENT_OTHER): Payer: Self-pay | Admitting: Urology

## 2022-10-28 NOTE — Telephone Encounter (Signed)
PT calling in due to not receiving plavix from pharmacy.  Stated has bee sent in same time as the other medications.  Pt is going to contact the pharmacy.

## 2022-10-28 NOTE — Progress Notes (Signed)
Spoke w/ via phone for pre-op interview--- pt Lab needs dos----  [employer] results------ current EKG in epic/ chat COVID test -----patient states asymptomatic no test needed Arrive at ------- 0815 on 10-30-2022 NPO after MN NO Solid Food.  Clear liquids from MN until--- 0715 Med rec completed Medications to take morning of surgery ----- coreg, klor-con Diabetic medication ----- do not take jardiance, metformin, glipizide morning of surgery/  do half dose tresiba morning of surgery Patient instructed no nail polish to be worn day of surgery Patient instructed to bring photo id and insurance card day of surgery Patient aware to have Driver (ride ) / caregiver    for 24 hours after surgery -- wife, susan Patient Special Instructions ----- will do fleet enema morning of surgery Pre-Op special Istructions -----  pt has office visit cardiac clearance from Dr Irish Lack on 10-21-2022 in epic/ chart Patient verbalized understanding of instructions that were given at this phone interview. Patient denies shortness of breath, chest pain, fever, cough at this phone interview.  Anesthesia Review: HTN;  chronic diastolic CHF (last exacerbation 10-17-2022);  CKD 3 w/ solitary right kidney since 2020;  OSA per pt last cpap which is broken few yrs, is scheduled to get retested;  DM 2  PCP:  Dr Melford Aase (lov 10-20-2022 epic) Cardiologist :  Dr Irish Lack Cassell Clement 10-21-2022) Chest x-ray : 10-18-2022 EKG : 10-17-2022 Echo : 08-14-2022 Stress test: no Cardiac Cath :  no Activity level:  pt denies sob w/ any activity Sleep Study/ CPAP : yes/ no Fasting Blood Sugar :      / Checks Blood Sugar -- times a day:  pt stated continue waiting on device from pharmacy Blood Thinner/ Instructions /Last Dose:  currently no , cardiology wants him to start on plavix after surgery ASA / Instructions/ Last Dose : no / allergic

## 2022-10-29 ENCOUNTER — Other Ambulatory Visit: Payer: Self-pay | Admitting: Student

## 2022-10-29 ENCOUNTER — Telehealth: Payer: Self-pay | Admitting: *Deleted

## 2022-10-29 NOTE — Telephone Encounter (Signed)
CALLED PATIENT TO REMIND OF PROCEDURE FOR 10-30-22, LVM FOR A RETURN CALL

## 2022-10-30 ENCOUNTER — Encounter (HOSPITAL_BASED_OUTPATIENT_CLINIC_OR_DEPARTMENT_OTHER): Payer: Self-pay | Admitting: Urology

## 2022-10-30 ENCOUNTER — Ambulatory Visit (HOSPITAL_COMMUNITY): Payer: Managed Care, Other (non HMO)

## 2022-10-30 ENCOUNTER — Encounter (HOSPITAL_BASED_OUTPATIENT_CLINIC_OR_DEPARTMENT_OTHER)
Admission: RE | Disposition: A | Discharge status: Home / Self Care | Payer: Self-pay | Source: Ambulatory Visit | Attending: Urology

## 2022-10-30 ENCOUNTER — Ambulatory Visit (HOSPITAL_BASED_OUTPATIENT_CLINIC_OR_DEPARTMENT_OTHER)
Admission: RE | Admit: 2022-10-30 | Discharge: 2022-10-30 | Disposition: A | Discharge status: Home / Self Care | Payer: Managed Care, Other (non HMO) | Source: Ambulatory Visit | Attending: Urology | Admitting: Urology

## 2022-10-30 ENCOUNTER — Ambulatory Visit (HOSPITAL_BASED_OUTPATIENT_CLINIC_OR_DEPARTMENT_OTHER): Payer: Managed Care, Other (non HMO) | Admitting: Anesthesiology

## 2022-10-30 ENCOUNTER — Other Ambulatory Visit: Payer: Self-pay

## 2022-10-30 DIAGNOSIS — Z01818 Encounter for other preprocedural examination: Secondary | ICD-10-CM

## 2022-10-30 DIAGNOSIS — I110 Hypertensive heart disease with heart failure: Secondary | ICD-10-CM | POA: Insufficient documentation

## 2022-10-30 DIAGNOSIS — Z7984 Long term (current) use of oral hypoglycemic drugs: Secondary | ICD-10-CM

## 2022-10-30 DIAGNOSIS — Z794 Long term (current) use of insulin: Secondary | ICD-10-CM | POA: Insufficient documentation

## 2022-10-30 DIAGNOSIS — N35919 Unspecified urethral stricture, male, unspecified site: Secondary | ICD-10-CM | POA: Insufficient documentation

## 2022-10-30 DIAGNOSIS — C61 Malignant neoplasm of prostate: Secondary | ICD-10-CM | POA: Diagnosis present

## 2022-10-30 DIAGNOSIS — I5032 Chronic diastolic (congestive) heart failure: Secondary | ICD-10-CM | POA: Insufficient documentation

## 2022-10-30 DIAGNOSIS — E119 Type 2 diabetes mellitus without complications: Secondary | ICD-10-CM | POA: Diagnosis not present

## 2022-10-30 DIAGNOSIS — G4733 Obstructive sleep apnea (adult) (pediatric): Secondary | ICD-10-CM | POA: Insufficient documentation

## 2022-10-30 DIAGNOSIS — Z6841 Body Mass Index (BMI) 40.0 and over, adult: Secondary | ICD-10-CM | POA: Insufficient documentation

## 2022-10-30 DIAGNOSIS — Z9989 Dependence on other enabling machines and devices: Secondary | ICD-10-CM | POA: Diagnosis not present

## 2022-10-30 DIAGNOSIS — Z7902 Long term (current) use of antithrombotics/antiplatelets: Secondary | ICD-10-CM | POA: Insufficient documentation

## 2022-10-30 DIAGNOSIS — Z905 Acquired absence of kidney: Secondary | ICD-10-CM | POA: Diagnosis not present

## 2022-10-30 DIAGNOSIS — I10 Essential (primary) hypertension: Secondary | ICD-10-CM | POA: Diagnosis not present

## 2022-10-30 HISTORY — DX: Hypokalemia: E87.6

## 2022-10-30 HISTORY — DX: Personal history of malignant neoplasm, unspecified: Z98.890

## 2022-10-30 HISTORY — DX: Chronic diastolic (congestive) heart failure: I50.32

## 2022-10-30 HISTORY — DX: Obstructive sleep apnea (adult) (pediatric): G47.33

## 2022-10-30 HISTORY — DX: Personal history of urinary calculi: Z87.442

## 2022-10-30 HISTORY — PX: SPACE OAR INSTILLATION: SHX6769

## 2022-10-30 HISTORY — PX: RADIOACTIVE SEED IMPLANT: SHX5150

## 2022-10-30 HISTORY — PX: CYSTOSCOPY: SHX5120

## 2022-10-30 HISTORY — DX: Nocturia: R35.1

## 2022-10-30 HISTORY — DX: Personal history of malignant neoplasm, unspecified: Z85.9

## 2022-10-30 HISTORY — DX: Type 2 diabetes mellitus without complications: E11.9

## 2022-10-30 HISTORY — DX: Long term (current) use of insulin: Z79.4

## 2022-10-30 LAB — POCT I-STAT, CHEM 8
BUN: 15 mg/dL (ref 6–20)
Calcium, Ion: 1.22 mmol/L (ref 1.15–1.40)
Chloride: 102 mmol/L (ref 98–111)
Creatinine, Ser: 1.1 mg/dL (ref 0.61–1.24)
Glucose, Bld: 145 mg/dL — ABNORMAL HIGH (ref 70–99)
HCT: 49 % (ref 39.0–52.0)
Hemoglobin: 16.7 g/dL (ref 13.0–17.0)
Potassium: 3.5 mmol/L (ref 3.5–5.1)
Sodium: 143 mmol/L (ref 135–145)
TCO2: 28 mmol/L (ref 22–32)

## 2022-10-30 LAB — GLUCOSE, CAPILLARY: Glucose-Capillary: 130 mg/dL — ABNORMAL HIGH (ref 70–99)

## 2022-10-30 SURGERY — INSERTION, RADIATION SOURCE, PROSTATE
Anesthesia: General | Site: Prostate

## 2022-10-30 MED ORDER — GLYCOPYRROLATE PF 0.2 MG/ML IJ SOSY
PREFILLED_SYRINGE | INTRAMUSCULAR | Status: DC | PRN
  Administered 2022-10-30 (×2): .2 mg via INTRAVENOUS

## 2022-10-30 MED ORDER — OXYCODONE-ACETAMINOPHEN 5-325 MG PO TABS: 1.0000 | ORAL_TABLET | Freq: Four times a day (QID) | ORAL | 0 refills | Status: DC | PRN

## 2022-10-30 MED ORDER — TAMSULOSIN HCL 0.4 MG PO CAPS: 0.4000 mg | ORAL_CAPSULE | Freq: Every day | ORAL | 11 refills | Status: AC | PRN

## 2022-10-30 MED ORDER — PROPOFOL 10 MG/ML IV BOLUS
INTRAVENOUS | Status: AC
  Filled 2022-10-30: qty 20

## 2022-10-30 MED ORDER — ROCURONIUM BROMIDE 10 MG/ML (PF) SYRINGE
PREFILLED_SYRINGE | INTRAVENOUS | Status: DC | PRN
  Administered 2022-10-30: 50 mg via INTRAVENOUS

## 2022-10-30 MED ORDER — PHENYLEPHRINE HCL-NACL 20-0.9 MG/250ML-% IV SOLN
INTRAVENOUS | Status: DC | PRN
  Administered 2022-10-30: 40 ug/min via INTRAVENOUS

## 2022-10-30 MED ORDER — ONDANSETRON HCL 4 MG/2ML IJ SOLN
INTRAMUSCULAR | Status: AC
  Filled 2022-10-30: qty 2

## 2022-10-30 MED ORDER — STERILE WATER FOR IRRIGATION IR SOLN
Status: DC | PRN
  Administered 2022-10-30: 3 mL

## 2022-10-30 MED ORDER — PHENYLEPHRINE HCL (PRESSORS) 10 MG/ML IV SOLN
INTRAVENOUS | Status: AC
  Filled 2022-10-30: qty 1

## 2022-10-30 MED ORDER — DEXAMETHASONE SODIUM PHOSPHATE 10 MG/ML IJ SOLN
INTRAMUSCULAR | Status: AC
  Filled 2022-10-30: qty 1

## 2022-10-30 MED ORDER — FENTANYL CITRATE (PF) 100 MCG/2ML IJ SOLN
INTRAMUSCULAR | Status: AC
  Filled 2022-10-30: qty 2

## 2022-10-30 MED ORDER — MIDAZOLAM HCL 2 MG/2ML IJ SOLN
INTRAMUSCULAR | Status: AC
  Filled 2022-10-30: qty 2

## 2022-10-30 MED ORDER — LIDOCAINE 2% (20 MG/ML) 5 ML SYRINGE
INTRAMUSCULAR | Status: DC | PRN
  Administered 2022-10-30: 60 mg via INTRAVENOUS

## 2022-10-30 MED ORDER — GLYCOPYRROLATE PF 0.2 MG/ML IJ SOSY
PREFILLED_SYRINGE | INTRAMUSCULAR | Status: AC
  Filled 2022-10-30: qty 1

## 2022-10-30 MED ORDER — SODIUM CHLORIDE (PF) 0.9 % IJ SOLN
INTRAMUSCULAR | Status: DC | PRN
  Administered 2022-10-30: 10 mL

## 2022-10-30 MED ORDER — LACTATED RINGERS IV SOLN: INTRAVENOUS | Status: DC | PRN

## 2022-10-30 MED ORDER — PHENYLEPHRINE 80 MCG/ML (10ML) SYRINGE FOR IV PUSH (FOR BLOOD PRESSURE SUPPORT)
PREFILLED_SYRINGE | INTRAVENOUS | Status: DC | PRN
  Administered 2022-10-30: 80 ug via INTRAVENOUS
  Administered 2022-10-30 (×2): 160 ug via INTRAVENOUS

## 2022-10-30 MED ORDER — MIDAZOLAM HCL 5 MG/5ML IJ SOLN
INTRAMUSCULAR | Status: DC | PRN
  Administered 2022-10-30: 2 mg via INTRAVENOUS

## 2022-10-30 MED ORDER — ROCURONIUM BROMIDE 10 MG/ML (PF) SYRINGE
PREFILLED_SYRINGE | INTRAVENOUS | Status: AC
  Filled 2022-10-30: qty 10

## 2022-10-30 MED ORDER — SENNOSIDES-DOCUSATE SODIUM 8.6-50 MG PO TABS: 1.0000 | ORAL_TABLET | Freq: Two times a day (BID) | ORAL | 0 refills | Status: DC

## 2022-10-30 MED ORDER — PROPOFOL 10 MG/ML IV BOLUS
INTRAVENOUS | Status: DC | PRN
  Administered 2022-10-30: 10 mg via INTRAVENOUS
  Administered 2022-10-30: 150 mg via INTRAVENOUS

## 2022-10-30 MED ORDER — CIPROFLOXACIN IN D5W 400 MG/200ML IV SOLN
400.0000 mg | INTRAVENOUS | Status: AC
  Administered 2022-10-30: 400 mg via INTRAVENOUS

## 2022-10-30 MED ORDER — FENTANYL CITRATE (PF) 100 MCG/2ML IJ SOLN
INTRAMUSCULAR | Status: DC | PRN
  Administered 2022-10-30: 100 ug via INTRAVENOUS

## 2022-10-30 MED ORDER — FLEET ENEMA 7-19 GM/118ML RE ENEM: 1.0000 | ENEMA | Freq: Once | RECTAL | Status: DC

## 2022-10-30 MED ORDER — DEXAMETHASONE SODIUM PHOSPHATE 10 MG/ML IJ SOLN
INTRAMUSCULAR | Status: DC | PRN
  Administered 2022-10-30: 5 mg via INTRAVENOUS

## 2022-10-30 MED ORDER — CIPROFLOXACIN IN D5W 400 MG/200ML IV SOLN
INTRAVENOUS | Status: AC
  Filled 2022-10-30: qty 200

## 2022-10-30 MED ORDER — ACETAMINOPHEN 500 MG PO TABS
1000.0000 mg | ORAL_TABLET | Freq: Once | ORAL | Status: AC
  Administered 2022-10-30: 1000 mg via ORAL

## 2022-10-30 MED ORDER — IOHEXOL 300 MG/ML  SOLN
INTRAMUSCULAR | Status: DC | PRN
  Administered 2022-10-30: 7 mL

## 2022-10-30 MED ORDER — SODIUM CHLORIDE 0.9 % IR SOLN
Status: DC | PRN
  Administered 2022-10-30: 1000 mL via INTRAVESICAL

## 2022-10-30 MED ORDER — SUGAMMADEX SODIUM 200 MG/2ML IV SOLN
INTRAVENOUS | Status: DC | PRN
  Administered 2022-10-30: 200 mg via INTRAVENOUS

## 2022-10-30 MED ORDER — FENTANYL CITRATE (PF) 100 MCG/2ML IJ SOLN
25.0000 ug | INTRAMUSCULAR | Status: DC | PRN
  Administered 2022-10-30: 25 ug via INTRAVENOUS

## 2022-10-30 MED ORDER — ACETAMINOPHEN 500 MG PO TABS
ORAL_TABLET | ORAL | Status: AC
  Filled 2022-10-30: qty 2

## 2022-10-30 MED ORDER — PHENYLEPHRINE 80 MCG/ML (10ML) SYRINGE FOR IV PUSH (FOR BLOOD PRESSURE SUPPORT)
PREFILLED_SYRINGE | INTRAVENOUS | Status: AC
  Filled 2022-10-30: qty 10

## 2022-10-30 MED ORDER — ONDANSETRON HCL 4 MG/2ML IJ SOLN
INTRAMUSCULAR | Status: DC | PRN
  Administered 2022-10-30: 4 mg via INTRAVENOUS

## 2022-10-30 MED ORDER — CEPHALEXIN 500 MG PO CAPS: 500.0000 mg | ORAL_CAPSULE | Freq: Two times a day (BID) | ORAL | 0 refills | Status: AC

## 2022-10-30 MED ORDER — SODIUM CHLORIDE 0.9 % IV SOLN: INTRAVENOUS | Status: DC

## 2022-10-30 SURGICAL SUPPLY — 52 items
BAG DRN RND TRDRP ANRFLXCHMBR (UROLOGICAL SUPPLIES) ×4
BAG URINE DRAIN 2000ML AR STRL (UROLOGICAL SUPPLIES) ×2 IMPLANT
BLADE CLIPPER SENSICLIP SURGIC (BLADE) ×2 IMPLANT
Bard QuickLink Cartridges with BrachySource I -125 IMPLANT
CATH COUDE FOLEY 2W 5CC 18FR (CATHETERS) IMPLANT
CATH COUNCIL 22FR (CATHETERS) IMPLANT
CATH FOLEY 2W COUNCIL 20FR 5CC (CATHETERS) IMPLANT
CATH FOLEY 2WAY SLVR  5CC 16FR (CATHETERS) ×2
CATH FOLEY 2WAY SLVR 5CC 16FR (CATHETERS) ×2 IMPLANT
CATH ROBINSON RED A/P 16FR (CATHETERS) IMPLANT
CATH ROBINSON RED A/P 20FR (CATHETERS) ×2 IMPLANT
CLOTH BEACON ORANGE TIMEOUT ST (SAFETY) ×2 IMPLANT
CNTNR URN SCR LID CUP LEK RST (MISCELLANEOUS) ×2 IMPLANT
CONT SPEC 4OZ STRL OR WHT (MISCELLANEOUS) ×2
COVER BACK TABLE 60X90IN (DRAPES) ×2 IMPLANT
COVER MAYO STAND STRL (DRAPES) ×2 IMPLANT
DRSG TEGADERM 4X4.75 (GAUZE/BANDAGES/DRESSINGS) ×2 IMPLANT
DRSG TEGADERM 8X12 (GAUZE/BANDAGES/DRESSINGS) ×2 IMPLANT
GAUZE SPONGE 4X4 12PLY STRL (GAUZE/BANDAGES/DRESSINGS) IMPLANT
GEL ULTRASOUND 20GR AQUASONIC (MISCELLANEOUS) ×4 IMPLANT
GLOVE BIO SURGEON STRL SZ 6.5 (GLOVE) IMPLANT
GLOVE BIO SURGEON STRL SZ7.5 (GLOVE) ×2 IMPLANT
GLOVE BIO SURGEON STRL SZ8 (GLOVE) IMPLANT
GLOVE BIOGEL PI IND STRL 6.5 (GLOVE) IMPLANT
GLOVE SURG ORTHO 8.5 STRL (GLOVE) ×2 IMPLANT
GOWN STRL REUS W/TWL LRG LVL3 (GOWN DISPOSABLE) ×2 IMPLANT
GRID BRACH TEMP 18GA 2.8X3X.75 (MISCELLANEOUS) ×2 IMPLANT
GUIDEWIRE STR DUAL SENSOR (WIRE) IMPLANT
HOLDER FOLEY CATH W/STRAP (MISCELLANEOUS) IMPLANT
IMPL SPACEOAR VUE SYSTEM (Spacer) ×2 IMPLANT
IMPLANT SPACEOAR VUE SYSTEM (Spacer) ×2 IMPLANT
IV NS 1000ML (IV SOLUTION) ×2
IV NS 1000ML BAXH (IV SOLUTION) ×2 IMPLANT
KIT TURNOVER CYSTO (KITS) ×2 IMPLANT
NDL BRACHY 18G 5PK (NEEDLE) ×8 IMPLANT
NDL BRACHY 18G SINGLE (NEEDLE) IMPLANT
NDL PK MORGANSTERN STABILIZ (NEEDLE) ×2 IMPLANT
NEEDLE BRACHY 18G 5PK (NEEDLE) ×8 IMPLANT
NEEDLE BRACHY 18G SINGLE (NEEDLE) IMPLANT
NEEDLE PK MORGANSTERN STABILIZ (NEEDLE) ×2 IMPLANT
PACK CYSTO (CUSTOM PROCEDURE TRAY) ×2 IMPLANT
SHEATH ULTRASOUND LF (SHEATH) IMPLANT
SHEATH ULTRASOUND LTX NONSTRL (SHEATH) IMPLANT
SLEEVE SCD COMPRESS KNEE MED (STOCKING) ×2 IMPLANT
SUT BONE WAX W31G (SUTURE) IMPLANT
SYR 10ML LL (SYRINGE) ×2 IMPLANT
SYR CONTROL 10ML LL (SYRINGE) ×2 IMPLANT
SYR TOOMEY IRRIG 70ML (MISCELLANEOUS) ×2
SYRINGE TOOMEY IRRIG 70ML (MISCELLANEOUS) IMPLANT
TOWEL OR 17X24 6PK STRL BLUE (TOWEL DISPOSABLE) ×2 IMPLANT
UNDERPAD 30X36 HEAVY ABSORB (UNDERPADS AND DIAPERS) ×4 IMPLANT
WATER STERILE IRR 500ML POUR (IV SOLUTION) ×2 IMPLANT

## 2022-10-30 NOTE — Transfer of Care (Signed)
Immediate Anesthesia Transfer of Care Note  Patient: Charlis Stuchell  Procedure(s) Performed: RADIOACTIVE SEED IMPLANT/BRACHYTHERAPY IMPLANT (Prostate) SPACE OAR INSTILLATION (Prostate) CYSTOSCOPY FLEXIBLE (Bladder)  Patient Location: PACU  Anesthesia Type:General  Level of Consciousness: awake, alert , and oriented  Airway & Oxygen Therapy: Patient Spontanous Breathing and Patient connected to face mask oxygen  Post-op Assessment: Report given to RN  Post vital signs: Reviewed and stable  Last Vitals:  Vitals Value Taken Time  BP 143/89 10/30/22 1223  Temp    Pulse 77 10/30/22 1226  Resp 19 10/30/22 1226  SpO2 96 % 10/30/22 1226  Vitals shown include unvalidated device data.  Last Pain:  Vitals:   10/30/22 0853  TempSrc: Oral  PainSc: 0-No pain      Patients Stated Pain Goal: 6 ([id number])  Complications: No notable events documented.

## 2022-10-30 NOTE — Anesthesia Postprocedure Evaluation (Signed)
Anesthesia Post Note  Patient: Travis Deleon  Procedure(s) Performed: RADIOACTIVE SEED IMPLANT/BRACHYTHERAPY IMPLANT (Prostate) SPACE OAR INSTILLATION (Prostate) CYSTOSCOPY FLEXIBLE (Bladder)     Patient location during evaluation: PACU Anesthesia Type: General Level of consciousness: awake and alert Pain management: pain level controlled Vital Signs Assessment: post-procedure vital signs reviewed and stable Respiratory status: spontaneous breathing, nonlabored ventilation and respiratory function stable Cardiovascular status: blood pressure returned to baseline and stable Postop Assessment: no apparent nausea or vomiting Anesthetic complications: no  No notable events documented.  Last Vitals:  Vitals:   10/30/22 1300 10/30/22 1315  BP: 125/77 124/76  Pulse: 69 66  Resp: 17 16  Temp:    SpO2: 90% 95%    Last Pain:  Vitals:   10/30/22 1224  TempSrc:   PainSc: 0-No pain                 Gavan Nordby,W. EDMOND

## 2022-10-30 NOTE — Anesthesia Procedure Notes (Signed)
Procedure Name: Intubation Date/Time: 10/30/2022 10:43 AM  Performed by: Rogers Blocker, CRNAPre-anesthesia Checklist: Patient identified, Emergency Drugs available, Suction available and Patient being monitored Patient Re-evaluated:Patient Re-evaluated prior to induction Oxygen Delivery Method: Circle System Utilized Preoxygenation: Pre-oxygenation with 100% oxygen Induction Type: IV induction Ventilation: Mask ventilation without difficulty Laryngoscope Size: Mac and 4 Grade View: Grade I Tube type: Oral Tube size: 7.0 mm Number of attempts: 1 Airway Equipment and Method: Stylet and Bite block Placement Confirmation: ETT inserted through vocal cords under direct vision, positive ETCO2 and breath sounds checked- equal and bilateral Secured at: 23 cm Tube secured with: Tape Dental Injury: Teeth and Oropharynx as per pre-operative assessment

## 2022-10-30 NOTE — Op Note (Unsigned)
NAME: Travis Deleon, Travis Deleon MEDICAL RECORD NO: [id number] ACCOUNT NO: [id number] DATE OF BIRTH: 01-08-1961 FACILITY: [facility] LOCATION: [facility] PHYSICIAN: [name], MD  Operative Report   DATE OF PROCEDURE: 10/30/2022  PREOPERATIVE DIAGNOSIS:  Moderate risk prostate cancer.  PROCEDURE:   1.  Radiation brachytherapy seed implantation with ultrasound guidance. 2.  SpaceOAR implantation. 3.  Cystoscopy with catheter placement, complicated.  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  FINDINGS:   1.  Excellent placement of brachytherapy seeds as per prescribed dose. 2.  Low-grade pendulous urethral stricture estimated to be approximately 16-French.  However, this did result in false pass with initial catheter placement requiring cystoscopy and catheter placement over wire.  DRAINS:  22-French Council catheter to straight drain.  Radiation parameters 145 Gy prescribed dose over 66 radiation sources given through 16 catheters.  INDICATIONS:  This is a 62 year old man with significant cardiovascular metabolic comorbidity.  He was found to have elevated PSA to have multifocal adenocarcinoma of the prostate, moderate risk.  After discussion of therapeutic options during multiple  visits.  He elected to undergo curative intent therapy with primary brachytherapy.  He presents for this today.  He has cardiac clearance.  He has held his Plavix as directed.  Informed consent was obtained and placed in medical record.  PROCEDURE DETAILS:  The patient was identified and verified, procedure being prostate brachytherapy, seed implantation was confirmed, timeout was performed.  Intravenous antibiotics were verified.  The patient was placed into medium lithotomy position.   Sterile field was created, prepped and draped the patient's penis, perineum, and proximal thighs using iodine.  Transrectal ultrasound probe was introduced as was a rectal catheter.   Foley catheter was placed in standard fashion per nursing staff. During  ultrasound interrogation of the prostate by radiation team they noted that the patient's Foley catheter balloon was not obviously within the lumen of the bladder, but appeared to be malpositioned in the proximal penis of the urethra. I was called to  evaluate the clinical situation. I found that the catheter did not irrigate well.  This is malpositioned, I attempted to reposition this x1 manually, but also was not satisfied with the position.  That was removed and flexible cystoscopy was performed  using 16-French flexible cystoscope.  Inspection of anterior and posterior urethra did reveal urethral false pass in the proximal urethra in the right lateral aspect. Just proximal to this, there was a mild area of narrowing, stricture only approximately  16-French. Cystoscope was able to navigate through this.  The prostatic urethra was completely unremarkable as was the area of the external sphincter.  Bladder was unremarkable as well.  Sensory wire was placed into the bladder.  A new 22-French  council-tip catheter was placed over this, 10 mL water in the balloon.  The radiation team resumed radiation planting as per separate note calling for a 145 gray prescribed dose given across 16 seeds and over 16 catheters.  I discussed with Dr. Tammi Klippel,  Radiation Oncology and my recommendation was to proceed with prostate brachytherapy as the area of false pass that was not in the prostatic urethra, but appeared to be several cm distal to this. I was less concerned about radiation, worsening any sort of  trauma or stricture in this location.  Next, the brachytherapy seeds were placed as per prescribed dose in an anterior to posterior direction.  Attention was directed at [facility] placement.  The prostate anchors were removed.  The prostate  was taken off  of anterior tension using a SpaceOAR introducer needle approximately 1 cm anterior to the  rectal verge and under ultrasound guidance, this was navigated to the prerectal fat stripe mid gland.  This was corroborated by hydrodissection with 2 mL of normal  saline and then 10 mL of SpaceOAR gel matrix was placed as per manufacturer's directions, which filled an excellent posterior displacement of the anterior rectal wall and in situ Foley catheter was removed and cystourethroscopy was once again performed.   Again, there was noted to be an area of false pass in the right proximal pendulous urethra.  This appeared to be approximately 2-3 cm distal to his external sphincter.  Prostatic urethra was unremarkable as was the bladder.  There was no evidence of  brachytherapy seeds within the lumen of the bladder.  Sensory wire was once again advanced and was once again exchanged for the 22-French council-tip catheter, 10 mL water in the balloon, irrigated quantitatively.  Procedure was then terminated.  The patient tolerated the procedure well. He was taken to the postanesthesia care in stable condition.  We will plan to leave this catheter in situ for approximately 12-14 days before trial of void.  He will hold his  Plavix until at least 03/19 or when his urine becomes grossly clear and free of blood.   PUS D: 10/30/2022 12:48:32 pm T: 10/30/2022 1:20:00 pm  JOB: [id number]

## 2022-10-30 NOTE — Anesthesia Preprocedure Evaluation (Addendum)
Anesthesia Evaluation  Patient identified by MRN, date of birth, ID band Patient awake    Reviewed: Allergy & Precautions, H&P , NPO status , Patient's Chart, lab work & pertinent test results, reviewed documented beta blocker date and time   Airway Mallampati: III  TM Distance: >3 FB Neck ROM: Full    Dental no notable dental hx. (+) Teeth Intact, Dental Advisory Given   Pulmonary sleep apnea and Continuous Positive Airway Pressure Ventilation    Pulmonary exam normal breath sounds clear to auscultation       Cardiovascular hypertension, Pt. on medications and Pt. on home beta blockers  Rhythm:Regular Rate:Normal     Neuro/Psych negative neurological ROS  negative psych ROS   GI/Hepatic negative GI ROS, Neg liver ROS,,,  Endo/Other  diabetes, Type 2, Oral Hypoglycemic Agents  Morbid obesity  Renal/GU Renal disease  negative genitourinary   Musculoskeletal   Abdominal   Peds  Hematology negative hematology ROS (+)   Anesthesia Other Findings   Reproductive/Obstetrics negative OB ROS                             Anesthesia Physical Anesthesia Plan  ASA: 3  Anesthesia Plan: General   Post-op Pain Management: Tylenol PO (pre-op)*   Induction: Intravenous  PONV Risk Score and Plan: 3 and Ondansetron, Dexamethasone and Midazolam  Airway Management Planned: Oral ETT and LMA  Additional Equipment:   Intra-op Plan:   Post-operative Plan: Extubation in OR  Informed Consent: I have reviewed the patients History and Physical, chart, labs and discussed the procedure including the risks, benefits and alternatives for the proposed anesthesia with the patient or authorized representative who has indicated his/her understanding and acceptance.     Dental advisory given  Plan Discussed with: CRNA  Anesthesia Plan Comments:        Anesthesia Quick Evaluation

## 2022-10-30 NOTE — H&P (Signed)
Travis Deleon is an 62 y.o. male.    Chief Complaint: Pre-Op Prostate Brachytherapy Seed Implantation  HPI:   1 - Moderate Risk Prostate Cancer - Pt's father with prostate cancer treated surgically. 4/12 cores (all left lateral) up to 80% grade 3 disease on TRUS BX 02/2022 on eval 7.8. TRUS 44 mL. No median. Some left lateral hypoechoic. PET CT (ordered by rad-on) negative for mets 04/2022.    2 - Urolithiasis / Solitary Rt Kidney - s/p left lap nephrectomy 2018 for atrophic kidney from chronic stone per report. CT 04/2022 stone free in solitary Rt kidney.   PMH sig for large obesty, HTN, severe DM2 (A1c 10s previously). Wife Travis Deleon very involved. 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, mostly in Engineer, technical sales and Radio broadcast assistant. His PCP is Nicholes Rough PA with [facility].   Today " Travis Deleon " is seen to proceed with prostate brachytherapy seed implantation + SPACE-OAR. Card clearance (low risk) on file. Holding plavix. Most recetn UA without significant infectious parameters. A1c 8 most recently.   Past Medical History:  Diagnosis Date   Acquired solitary kidney 01/23/2019   right side;   left kidney removed due to atrophy decreased function and staghorn stones   Chronic diastolic CHF (congestive heart failure) [facility])    cardiologist---- dr Irish Lack;  preserved ef;  last exacerbation 10-17-2022   History of kidney stones    History of sepsis 08/13/2022   admission in epic;   CAP RLL w/ sepsis and acute respiratory failure hypoxia   History of squamous cell carcinoma excision    multiple excisions   Hypertension    Hypokalemia    Malignant neoplasm prostate [facility]) 02/2022   urologist-- dr [name]  radiation oncologist-- dr Tammi Klippel;  dx 07/ 2023  Gleason 4+3   Nocturia    OSA (obstructive sleep apnea)    10-28-2022 per pt dx approx 20 yrs ago cpap broke;  is on schedule to get retested   Type 2 diabetes mellitus treated with insulin ([place])    followed by pcp;    (10-28-2022  pt stated is waiting on  his pharmacy to fill rx from pcp for glucose machine)    Past Surgical History:  Procedure Laterality Date   LAPAROSCOPIC NEPHRECTOMY, HAND ASSISTED Left 01/23/2019   @ [place] in [place] by dr j. Redmond Pulling for left renal atrophy and staghorn stones    History reviewed. No pertinent family history. Social History:  reports that he has never smoked. He has never used smokeless tobacco. He reports that he does not currently use alcohol. He reports that he does not use drugs.  Allergies:  Allergies  Allergen Reactions   Aspirin Hives and Rash    No medications prior to admission.    No results found for this or any previous visit (from the past 48 hour(s)). No results found.  Review of Systems  Constitutional:  Negative for chills and fever.  All other systems reviewed and are negative.   Height 5\' 9"  (1.753 m), weight 124.7 kg. Physical Exam Vitals reviewed.  Eyes:     Pupils: Pupils are equal, round, and reactive to light.  Cardiovascular:     Rate and Rhythm: Normal rate.  Pulmonary:     Effort: Pulmonary effort is normal.  Abdominal:     General: Abdomen is flat.  Genitourinary:    Comments: No CVAT Musculoskeletal:        General: Normal range of motion.     Cervical back: Normal range  of motion.  Neurological:     General: No focal deficit present.     Mental Status: He is alert.  Psychiatric:        Mood and Affect: Mood normal.      Assessment/Plan  Proceed as planned with prostate brachytherapy seed implantation and SPACE-OAR. Risks, benefits, peri-op course discussed extensively previously and reiterated today.   [name], MD 10/30/2022, 7:28 AM

## 2022-10-30 NOTE — Brief Op Note (Signed)
10/30/2022  12:40 PM  PATIENT:  Travis Deleon  62 y.o. male  PRE-OPERATIVE DIAGNOSIS:  PROSTATE CANCER  POST-OPERATIVE DIAGNOSIS:  PROSTATE CANCER  PROCEDURE:  Procedure(s) with comments: RADIOACTIVE SEED IMPLANT/BRACHYTHERAPY IMPLANT (N/A) - 90 MINS SPACE OAR INSTILLATION (N/A) CYSTOSCOPY FLEXIBLE  SURGEON:  Surgeon(s) and Role:    * [name], MD - Primary    * Tyler Pita, MD  PHYSICIAN ASSISTANT:   ASSISTANTS: none   ANESTHESIA:   general  EBL:  20 mL   BLOOD ADMINISTERED:none  DRAINS:  57F council foley to gravity    LOCAL MEDICATIONS USED:  NONE  SPECIMEN:  No Specimen  DISPOSITION OF SPECIMEN:  N/A  COUNTS:  YES  TOURNIQUET:  * No tourniquets in log *  DICTATION: .Other Dictation: Dictation Number [id number]  PLAN OF CARE: Discharge to home after PACU  PATIENT DISPOSITION:  PACU - hemodynamically stable.   Delay start of Pharmacological VTE agent (>24hrs) due to surgical blood loss or risk of bleeding: yes

## 2022-10-30 NOTE — Discharge Instructions (Addendum)
1 - You may have urinary urgency (bladder spasms) and bloody urine on / off with catheter in place.   This is normal.  2 - Restart plavix on Tuesday 11/03/22 as long as urine is not visibly bloody. If urine remains visibly bloody, then continue to hold.   3 - Call MD or go to ER for fever >102, severe pain / nausea / vomiting not relieved by medications, or acute change in medical status    No acetaminophen/Tylenol until after 3:00 pm today if needed.       Radioactive Seed Implant Home Care Instructions   Activity:    Rest for the remainder of the day.  Do not drive or operate equipment today.  You may resume normal  activities in a few days as instructed by your physician, without risk of harmful radiation exposure to those around you, provided you follow the time and distance precautions on the Radiation Oncology Instruction Sheet.   Meals: Drink plenty of lipuids and eat light foods, such as gelatin or soup this evening .  You may return to normal meal plan tomorrow.  Return To Work: You may return to work as instructed by Naval architect.  Special Instruction:   If any seeds are found, use tweezers to pick up seeds and place in a glass container of any kind and bring to your physician's office.  Call your physician if any of these symptoms occur:  Persistent or heavy bleeding Urine stream diminishes or stops completely after catheter is removed Fever equal to or greater than 101 degrees F Cloudy urine with a strong foul odor Severe pain  You may feel some burning pain and/or hesitancy when you urinate after the catheter is removed.  These symptoms may increase over the next few weeks, but should diminish within forur to six weeks.  Applying moist heat to the lower abdomen or a hot tub bath may help relieve the pain.  If the discomfort becomes severe, please call your physician for additional medications.            Post Anesthesia Home Care  Instructions  Activity: Get plenty of rest for the remainder of the day. A responsible individual must stay with you for 24 hours following the procedure.  For the next 24 hours, DO NOT: -Drive a car -Paediatric nurse -Drink alcoholic beverages -Take any medication unless instructed by your physician -Make any legal decisions or sign important papers.  Meals: Start with liquid foods such as gelatin or soup. Progress to regular foods as tolerated. Avoid greasy, spicy, heavy foods. If nausea and/or vomiting occur, drink only clear liquids until the nausea and/or vomiting subsides. Call your physician if vomiting continues.  Special Instructions/Symptoms: Your throat may feel dry or sore from the anesthesia or the breathing tube placed in your throat during surgery. If this causes discomfort, gargle with warm salt water. The discomfort should disappear within 24 hours.

## 2022-10-30 NOTE — Progress Notes (Signed)
  Radiation Oncology         [phone] ________________________________  Name: Travis Deleon MRN: [id number]  Date: 10/30/2022  DOB: Nov 22, 1960       Prostate Seed Implant  [id number], [name] Alert, MD  No ref. provider found  DIAGNOSIS:  62 y.o. gentleman with Stage T1c adenocarcinoma of the prostate with Gleason score of 4+3, and PSA of 7.83.  Oncology History  Malignant neoplasm of prostate ([place])  03/03/2022 Cancer Staging   Staging form: Prostate, AJCC 8th Edition - Clinical stage from 03/03/2022: Stage IIC (cT1c, cN0, cM0, PSA: 7.8, Grade Group: 3) - Signed by Freeman Caldron, PA-C on 04/21/2022 Histopathologic type: Adenocarcinoma, NOS Stage prefix: Initial diagnosis Prostate specific antigen (PSA) range: Less than 10 Gleason primary pattern: 4 Gleason secondary pattern: 3 Gleason score: 7 Histologic grading system: 5 grade system Number of biopsy cores examined: 12 Number of biopsy cores positive: 4 Location of positive needle core biopsies: One side   04/21/2022 Initial Diagnosis   Malignant neoplasm of prostate ([place])       ICD-10-CM   1. Pre-op testing  Z01.818 CBG per Guidelines for Diabetes Management for Patients Undergoing Surgery ([facility], [facility], and [facility] only)    CBG per protocol    I-Stat, Chem 8 on day of surgery per protocol      PROCEDURE: Insertion of radioactive I-125 seeds into the prostate gland.  RADIATION DOSE: 145 Gy, definitive therapy.  TECHNIQUE: Travis Deleon was brought to the operating room with the urologist. He was placed in the dorsolithotomy position. He was catheterized and a rectal tube was inserted. The perineum was shaved, prepped and draped. The ultrasound probe was then introduced by me into the rectum to see the prostate gland.  TREATMENT DEVICE: I attached the needle grid to the ultrasound probe stand and anchor needles were placed.  3D PLANNING: The prostate was imaged in 3D using a sagittal sweep of the prostate probe. These images were  transferred to the planning computer. There, the prostate, urethra and rectum were defined on each axial reconstructed image. Then, the software created an optimized 3D plan and a few seed positions were adjusted. The quality of the plan was reviewed using [facility] information for the target and the following two organs at risk:  Urethra and Rectum.  Then the accepted plan was printed and handed off to the radiation therapist.  Under my supervision, the custom loading of the seeds and spacers was carried out using the quick loader.  These pre-loaded needles were then placed into the needle holder.Marland Kitchen  PROSTATE VOLUME STUDY:  Using transrectal ultrasound the volume of the prostate was verified to be 36.6 cc.  SPECIAL TREATMENT PROCEDURE/SUPERVISION AND HANDLING: The pre-loaded needles were then delivered by the urologist under sagittal guidance. A total of 16 needles were used to deposit 66 seeds in the prostate gland. The individual seed activity was 0.428 mCi.  SpaceOAR:  Yes  COMPLEX SIMULATION: At the end of the procedure, an anterior radiograph of the pelvis was obtained to document seed positioning and count. Cystoscopy was performed by the urologist to check the urethra and bladder.  MICRODOSIMETRY: At the end of the procedure, the patient was emitting 0.05 mR/hr at 1 meter. Accordingly, he was considered safe for hospital discharge.  PLAN: The patient will return to the radiation oncology clinic for post implant CT dosimetry in three weeks.   ________________________________  Travis Deleon, M.D.

## 2022-11-01 ENCOUNTER — Other Ambulatory Visit: Payer: Self-pay | Admitting: Student

## 2022-11-02 ENCOUNTER — Encounter (HOSPITAL_BASED_OUTPATIENT_CLINIC_OR_DEPARTMENT_OTHER): Payer: Self-pay | Admitting: Urology

## 2022-11-03 NOTE — Progress Notes (Signed)
RN spoke with patient to follow up post brachytherapy.  Patient reports doing well.  Aware of upcoming appointments for [facility] and [facility].   RN provided education/importance of keeping follow up's with [facility] for ongoing care.

## 2022-11-05 ENCOUNTER — Other Ambulatory Visit: Payer: Self-pay | Admitting: Student

## 2022-11-10 ENCOUNTER — Telehealth: Payer: Self-pay | Admitting: *Deleted

## 2022-11-10 NOTE — Telephone Encounter (Signed)
CALLED PATIENT TO REMIND OF POST SEED APPTS. FOR 11-12-22, SPOKE WITH PATIENT AND HE IS AWARE OF THESE APPTS.

## 2022-11-10 NOTE — Progress Notes (Signed)
  Radiation Oncology         [phone] ________________________________  Name: Travis Deleon MRN: [id number]  Date: 11/12/2022  DOB: 08-10-1961  COMPLEX SIMULATION NOTE  NARRATIVE:  The patient was brought to the [facility] today following prostate seed implantation approximately one month ago.  Identity was confirmed.  All relevant records and images related to the planned course of therapy were reviewed.  Then, the patient was set-up supine.  CT images were obtained.  The CT images were loaded into the planning software.  Then the prostate and rectum were contoured.  Treatment planning then occurred.  The implanted iodine 125 seeds were identified by the physics staff for projection of radiation distribution  I have requested : 3D Simulation  I have requested a DVH of the following structures: Prostate and rectum.    ________________________________  Sheral Apley Tammi Klippel, M.D.

## 2022-11-10 NOTE — Progress Notes (Signed)
Radiation Oncology         [phone] ________________________________  Name: Travis Deleon MRN: [id number]  Date: 11/12/2022  DOB: 1960/10/12  Post-Seed Follow-Up Visit Note  CC: [name], MD  [name], MD  Diagnosis:   62 y.o. gentleman with Stage T1c adenocarcinoma of the prostate with Gleason score of 4+3, and PSA of 7.83.     ICD-10-CM   1. Malignant neoplasm of prostate ([facility])  C61       Interval Since Last Radiation:  2 weeks 10/30/22:  Insertion of radioactive I-125 seeds into the prostate gland; 145 Gy, definitive therapy with placement of SpaceOAR gel.  Narrative:  The patient returns today for routine follow-up.  He is complaining of increased urinary frequency and urinary hesitation symptoms. Unfortunately, at the time of his procedure, he was found to have a mild urethral stricture that resulted in a difficulty foley catheter placement and subsequent false passage. Therefore, he was discharged home with foley catheter in place and did f/u with [name], NP on 11/06/22 but was complaining of flank pain, fevers and chills so a renal U/S was performed and the catheter was not removed. He was instead given Rocephin IM and started on oral Augmentin to cover potential UTI and prevent urosepsis. Fortunately, his urine culture returned negative. He has completed  the course of antibiotics and had a voiding trial in the urology office on 11/11/22 that was successful. He has been able to void on his own since that time and is currently without any complaints of dysuria, gross hematuria, straining to void or incontinence.  He does have noticeable increased frequency, urgency, hesitancy, intermittency and nocturia 4-5 times per night but feels that his symptoms are tolerable.  He has a prescription for Flomax but has not needed to use this so far.  His pre-implant score was 9. He denies any abdominal pain or bowel symptoms.  ALLERGIES:  is allergic to  aspirin.  Meds: Current Outpatient Medications  Medication Sig Dispense Refill   acetaminophen (TYLENOL) 500 MG tablet Take 1,000 mg by mouth as needed for moderate pain.     amLODipine (NORVASC) 10 MG tablet Take 10 mg by mouth every evening.     carvedilol (COREG) 25 MG tablet TAKE 1 TAB (25 MG TOTAL) BY MOUTH TWICE A DAY WITH MEALS 180 tablet 0   empagliflozin (JARDIANCE) 10 MG TABS tablet Take 1 tablet (10 mg total) by mouth daily before breakfast. (Patient taking differently: Take 10 mg by mouth daily before breakfast.) 30 tablet 5   glipiZIDE (GLUCOTROL) 10 MG tablet Take 20 mg by mouth daily before breakfast.     insulin degludec (TRESIBA) 200 UNIT/ML FlexTouch Pen Inject 90 Units into the skin daily.     losartan (COZAAR) 25 MG tablet Take 1 tablet (25 mg total) by mouth daily. (Patient taking differently: Take 25 mg by mouth daily.) 90 tablet 3   metFORMIN (GLUCOPHAGE) 1000 MG tablet Take 1,000 mg by mouth 2 (two) times daily with a meal.     oxyCODONE-acetaminophen (PERCOCET) 5-325 MG tablet Take 1 tablet by mouth every 6 (six) hours as needed for severe pain or moderate pain (post-operatively). 15 tablet 0   potassium chloride SA (KLOR-CON M) 20 MEQ tablet Take 1 tablet (20 mEq total) by mouth daily. (Patient taking differently: Take 20 mEq by mouth daily.) 30 tablet 0   rosuvastatin (CRESTOR) 10 MG tablet Take 10 mg by mouth every evening.     Semaglutide,0.25 or 0.5MG /DOS, (  OZEMPIC, 0.25 OR 0.5 MG/DOSE,) 2 MG/1.5ML SOPN Inject 0.25 mg into the skin once a week. (Patient not taking: Reported on 10/28/2022)     senna-docusate (SENOKOT-S) 8.6-50 MG tablet Take 1 tablet by mouth 2 (two) times daily. While taking strong pain meds to prevent constipation 10 tablet 0   spironolactone (ALDACTONE) 25 MG tablet Take 1 tablet (25 mg total) by mouth daily. (Patient taking differently: Take 25 mg by mouth daily.) 30 tablet 1   tamsulosin (FLOMAX) 0.4 MG CAPS capsule Take 1 capsule (0.4 mg total) by  mouth daily as needed. For urinary urgency after prostate radiation 30 capsule 11   torsemide (DEMADEX) 20 MG tablet Take 2 tablets (40 mg total) by mouth daily. (Patient taking differently: Take 40 mg by mouth daily.) 60 tablet 0   No current facility-administered medications for this visit.    Physical Findings: In general this is a well appearing Caucasian male in no acute distress. He's alert and oriented x4 and appropriate throughout the examination. Cardiopulmonary assessment is negative for acute distress and he exhibits normal effort.   Lab Findings: Lab Results  Component Value Date   WBC 7.4 10/17/2022   HGB 16.7 10/30/2022   HCT 49.0 10/30/2022   MCV 84.3 10/17/2022   PLT 381 10/17/2022    Radiographic Findings:  Patient underwent CT imaging in our clinic for post implant dosimetry. The CT will be reviewed by Dr. Tammi Klippel to confirm there is an adequate distribution of radioactive seeds throughout the prostate gland and ensure that there are no seeds in or near the rectum.  We suspect the final radiation plan and dosimetry will show appropriate coverage of the prostate gland. He understands that we will call and inform him of any unexpected findings on further review of his imaging and dosimetry.  Impression/Plan: 62 y.o. gentleman with Stage T1c adenocarcinoma of the prostate with Gleason score of 4+3, and PSA of 7.83.  The patient is recovering from the effects of radiation. His urinary symptoms should gradually improve over the next 4-6 months. We talked about this today. He is encouraged by his improvement already and is otherwise pleased with his outcome. We also talked about long-term follow-up for prostate cancer following seed implant. He understands that ongoing PSA determinations and digital rectal exams will help perform surveillance to rule out disease recurrence. He had a recent follow up appointment with [name], PA-C 11/11/22 for foley catheter removal and will  see him again on 11/25/22 for PVR and urinalysis.  He will see Dr. [name] in 01/2023 with post-treatment PSA prior to visit. He understands what to expect with his PSA measures. Patient was also educated today about some of the long-term effects from radiation including a small risk for rectal bleeding and possibly erectile dysfunction. We talked about some of the general management approaches to these potential complications. However, I did encourage the patient to contact our office or return at any point if he has questions or concerns related to his previous radiation and prostate cancer.    Nicholos Johns, PA-C

## 2022-11-12 ENCOUNTER — Ambulatory Visit
Admission: RE | Admit: 2022-11-12 | Discharge: 2022-11-12 | Disposition: A | Discharge status: Home / Self Care | Payer: Managed Care, Other (non HMO) | Source: Ambulatory Visit | Attending: Urology | Admitting: Urology

## 2022-11-12 ENCOUNTER — Encounter: Payer: Self-pay | Admitting: Urology

## 2022-11-12 ENCOUNTER — Ambulatory Visit
Admission: RE | Admit: 2022-11-12 | Discharge: 2022-11-12 | Disposition: A | Discharge status: Home / Self Care | Payer: Managed Care, Other (non HMO) | Source: Ambulatory Visit | Attending: Radiation Oncology | Admitting: Radiation Oncology

## 2022-11-12 VITALS — BP 130/74 | HR 68 | Temp 97.7°F | Resp 20 | Ht 69.0 in | Wt 272.2 lb

## 2022-11-12 DIAGNOSIS — C61 Malignant neoplasm of prostate: Secondary | ICD-10-CM

## 2022-11-12 DIAGNOSIS — Z510 Encounter for antineoplastic radiation therapy: Secondary | ICD-10-CM | POA: Diagnosis not present

## 2022-11-12 NOTE — Progress Notes (Signed)
Post-seed nursing interview for a diagnosis of 62 y.o. gentleman with Stage T1c adenocarcinoma of the prostate with Gleason score of 4+3, and PSA of 7.83.  Patient identity verified. Patient reports doing well. No other issues conveyed at this time.  Meaningful use complete. I-PSS score- 7 - Mild SHIM score- 1 NO sexual activity within 6 months. Urinary Management medication(s) Tamsulosin Urology appointment date- 11/2022 with Dr. [name] at [facility]- BP 130/74 (BP Location: Right Arm, Patient Position: Sitting, Cuff Size: Large)   Pulse 68   Temp 97.7 F (36.5 C) (Temporal)   Resp 20   Ht 5\' 9"  (1.753 m)   Wt 272 lb 3.2 oz (123.5 kg)   SpO2 100%   BMI 40.20 kg/m   This concludes the interview.   Leandra Kern, LPN

## 2022-11-13 ENCOUNTER — Encounter: Payer: Self-pay | Admitting: Radiation Oncology

## 2022-11-13 DIAGNOSIS — Z510 Encounter for antineoplastic radiation therapy: Secondary | ICD-10-CM | POA: Diagnosis not present

## 2022-11-13 NOTE — Progress Notes (Signed)
  Radiation Oncology         [phone]) [phone] ________________________________  Name: Travis Deleon MRN: [id number]  Date: 11/13/2022  DOB: 05-May-1961  3D Planning Note   Prostate Brachytherapy Post-Implant Dosimetry  Diagnosis: 62 y.o. gentleman with Stage T1c adenocarcinoma of the prostate with Gleason score of 4+3, and PSA of 7.83.  Narrative: On a previous date, Travis Deleon returned following prostate seed implantation for post implant planning. He underwent CT scan complex simulation to delineate the three-dimensional structures of the pelvis and demonstrate the radiation distribution.  Since that time, the seed localization, and complex isodose planning with dose volume histograms have now been completed.  Results:   Prostate Coverage - The dose of radiation delivered to the 90% or more of the prostate gland (D90) was 104.61% of the prescription dose. This exceeds our goal of greater than 90%. Rectal Sparing - The volume of rectal tissue receiving the prescription dose or higher was 0.0 cc. This falls under our thresholds tolerance of 1.0 cc.  Impression: The prostate seed implant appears to show adequate target coverage and appropriate rectal sparing.  Plan:  The patient will continue to follow with urology for ongoing PSA determinations. I would anticipate a high likelihood for local tumor control with minimal risk for rectal morbidity.  ________________________________  Sheral Apley Tammi Klippel, M.D.

## 2022-11-15 ENCOUNTER — Other Ambulatory Visit: Payer: Self-pay | Admitting: Student

## 2022-12-09 ENCOUNTER — Other Ambulatory Visit: Payer: Self-pay | Admitting: Student

## 2023-01-04 ENCOUNTER — Encounter: Payer: Self-pay | Admitting: *Deleted

## 2023-01-04 ENCOUNTER — Inpatient Hospital Stay: Payer: Managed Care, Other (non HMO) | Attending: Nurse Practitioner | Admitting: *Deleted

## 2023-01-04 DIAGNOSIS — C61 Malignant neoplasm of prostate: Secondary | ICD-10-CM

## 2023-01-04 NOTE — Progress Notes (Signed)
SCP reviewed and completed. 

## 2023-03-13 ENCOUNTER — Other Ambulatory Visit: Payer: Self-pay | Admitting: Internal Medicine

## 2023-03-13 DIAGNOSIS — E785 Hyperlipidemia, unspecified: Secondary | ICD-10-CM

## 2023-04-08 ENCOUNTER — Other Ambulatory Visit: Payer: Self-pay | Admitting: Internal Medicine

## 2023-04-08 DIAGNOSIS — I152 Hypertension secondary to endocrine disorders: Secondary | ICD-10-CM

## 2023-04-08 DIAGNOSIS — E1169 Type 2 diabetes mellitus with other specified complication: Secondary | ICD-10-CM

## 2023-04-08 DIAGNOSIS — Z794 Long term (current) use of insulin: Secondary | ICD-10-CM

## 2023-04-08 DIAGNOSIS — Z01810 Encounter for preprocedural cardiovascular examination: Secondary | ICD-10-CM

## 2023-04-08 DIAGNOSIS — Z6841 Body Mass Index (BMI) 40.0 and over, adult: Secondary | ICD-10-CM

## 2023-04-08 DIAGNOSIS — I5032 Chronic diastolic (congestive) heart failure: Secondary | ICD-10-CM

## 2023-04-29 ENCOUNTER — Telehealth: Payer: Self-pay | Admitting: *Deleted

## 2023-04-29 NOTE — Telephone Encounter (Signed)
Pt has been scheduled for a tele visit, 05/05/23 10:40, ok to add per EM, due to timing of surgery.  Consent on file / medications reconciled.

## 2023-04-29 NOTE — Telephone Encounter (Signed)
   Pre-operative Risk Assessment    Patient Name: Travis Deleon  DOB: Oct 15, 1960 MRN: [id number]      Request for Surgical Clearance    Procedure:   COLONOSCOPY  Date of Surgery:  Clearance 05/11/23                                 Surgeon:   Surgeon's Group or Practice Name:  [facility] Phone number:  [phone] Fax number:  [phone]   Type of Clearance Requested:   - Medical  - Pharmacy:  Hold Clopidogrel (Plavix) X'S 4 DAYS PRIOR   Type of Anesthesia:  Not Indicated   Additional requests/questions:    Wilhemina Cash   04/29/2023, 10:11 AM

## 2023-04-29 NOTE — Telephone Encounter (Signed)
Pt has been scheduled for a tele visit, 05/05/23 10:40, ok to add per EM, due to timing of surgery.  Consent on file / medications reconciled.     Patient Consent for Virtual Visit        Travis Deleon has provided verbal consent on 04/29/2023 for a virtual visit (video or telephone).   CONSENT FOR VIRTUAL VISIT FOR:  Travis Deleon  By participating in this virtual visit I agree to the following:  I hereby voluntarily request, consent and authorize [facility] and its employed or contracted physicians, physician assistants, nurse practitioners or other licensed health care professionals (the Practitioner), to provide me with telemedicine health care services (the "Services") as deemed necessary by the treating Practitioner. I acknowledge and consent to receive the Services by the Practitioner via telemedicine. I understand that the telemedicine visit will involve communicating with the Practitioner through live audiovisual communication technology and the disclosure of certain medical information by electronic transmission. I acknowledge that I have been given the opportunity to request an in-person assessment or other available alternative prior to the telemedicine visit and am voluntarily participating in the telemedicine visit.  I understand that I have the right to withhold or withdraw my consent to the use of telemedicine in the course of my care at any time, without affecting my right to future care or treatment, and that the Practitioner or I may terminate the telemedicine visit at any time. I understand that I have the right to inspect all information obtained and/or recorded in the course of the telemedicine visit and may receive copies of available information for a reasonable fee.  I understand that some of the potential risks of receiving the Services via telemedicine include:  Delay or interruption in medical evaluation due to technological equipment failure or  disruption; Information transmitted may not be sufficient (e.g. poor resolution of images) to allow for appropriate medical decision making by the Practitioner; and/or  In rare instances, security protocols could fail, causing a breach of personal health information.  Furthermore, I acknowledge that it is my responsibility to provide information about my medical history, conditions and care that is complete and accurate to the best of my ability. I acknowledge that Practitioner's advice, recommendations, and/or decision may be based on factors not within their control, such as incomplete or inaccurate data provided by me or distortions of diagnostic images or specimens that may result from electronic transmissions. I understand that the practice of medicine is not an exact science and that Practitioner makes no warranties or guarantees regarding treatment outcomes. I acknowledge that a copy of this consent can be made available to me via my patient portal [facility] MyChart), or I can request a printed copy by calling the office of [facility].    I understand that my insurance will be billed for this visit.   I have read or had this consent read to me. I understand the contents of this consent, which adequately explains the benefits and risks of the Services being provided via telemedicine.  I have been provided ample opportunity to ask questions regarding this consent and the Services and have had my questions answered to my satisfaction. I give my informed consent for the services to be provided through the use of telemedicine in my medical care

## 2023-04-29 NOTE — Telephone Encounter (Signed)
   Name: Travis Deleon  DOB: November 03, 1960  MRN: [id number]  Primary Cardiologist: Orbie Pyo, MD   Preoperative team, please contact this patient and set up a phone call appointment for further preoperative risk assessment. Please obtain consent and complete medication review. Thank you for your help.  I confirm that guidance regarding antiplatelet and oral anticoagulation therapy has been completed and, if necessary, noted below.  Per office protocol, if patient is without any new symptoms or concerns at the time of their virtual visit, he may hold Plavix for 5 days prior to procedure. Please resume Plavix as soon as possible postprocedure, at the discretion of the surgeon.     Joylene Grapes, NP 04/29/2023, 10:45 AM [facility]

## 2023-05-05 ENCOUNTER — Ambulatory Visit: Payer: Managed Care, Other (non HMO) | Attending: Physician Assistant

## 2023-05-05 DIAGNOSIS — Z01810 Encounter for preprocedural cardiovascular examination: Secondary | ICD-10-CM

## 2023-05-05 NOTE — Progress Notes (Signed)
Virtual Visit via Telephone Note   Because of Bridger Reddic co-morbid illnesses, he is at least at moderate risk for complications without adequate follow up.  This format is felt to be most appropriate for this patient at this time.  The patient did not have access to video technology/had technical difficulties with video requiring transitioning to audio format only (telephone).  All issues noted in this document were discussed and addressed.  No physical exam could be performed with this format.  Please refer to the patient's chart for his consent to telehealth for [facility].  Evaluation Performed:  Preoperative cardiovascular risk assessment _____________   Date:  05/05/2023   Patient ID:  Travis Deleon, DOB 11-18-1960, MRN [id number] Patient Location:  Home Provider location:   Office  Primary Care Provider:  Eartha Inch, MD Primary Cardiologist:  Orbie Pyo, MD  Chief Complaint / Patient Profile   62 y.o. y/o male with a h/o chronic diastolic congestive heart failure, type 2 diabetes mellitus, HTN, HLD who is pending colonoscopy on 05/11/23 and presents today for telephonic preoperative cardiovascular risk assessment.  History of Present Illness    Travis Deleon is a 62 y.o. male who presents via audio/video conferencing for a telehealth visit today.  Pt was last seen in cardiology clinic on 10/21/22 by Dr. Lynnette Caffey.  At that time Travis Deleon was doing well. The patient is now pending procedure as outlined above. Since his last visit, he has remained stable from a cardiac standpoint. He is able to achieve greater than 4 METs of activity.   Today he denies chest pain, shortness of breath, lower extremity edema, fatigue, palpitations, melena, hematuria, hemoptysis, diaphoresis, weakness, presyncope, syncope, orthopnea, and PND.   Past Medical History    Past Medical History:  Diagnosis Date   Acquired solitary kidney 01/23/2019   right side;   left kidney removed  due to atrophy decreased function and staghorn stones   Chronic diastolic CHF (congestive heart failure) [facility])    cardiologist---- dr Rise Paganini;  preserved ef;  last exacerbation 10-17-2022   History of kidney stones    History of sepsis 08/13/2022   admission in epic;   CAP RLL w/ sepsis and acute respiratory failure hypoxia   History of squamous cell carcinoma excision    multiple excisions   Hypertension    Hypokalemia    Malignant neoplasm prostate [facility]) 02/2022   urologist-- dr Berneice Heinrich  radiation oncologist-- dr Kathrynn Running;  dx 07/ 2023  Gleason 4+3   Nocturia    OSA (obstructive sleep apnea)    10-28-2022 per pt dx approx 20 yrs ago cpap broke;  is on schedule to get retested   Type 2 diabetes mellitus treated with insulin (HCC)    followed by pcp;    (10-28-2022  pt stated is waiting on his pharmacy to fill rx from pcp for glucose machine)   Past Surgical History:  Procedure Laterality Date   CYSTOSCOPY  10/30/2022   Procedure: CYSTOSCOPY FLEXIBLE;  Surgeon: Sebastian Ache, MD;  Location: [facility];  Service: Urology;;  No seeds detected in bladder per Dr. Berneice Heinrich   LAPAROSCOPIC NEPHRECTOMY, HAND ASSISTED Left 01/23/2019   @ [facility] in [place] by dr j. Andrey Campanile for left renal atrophy and staghorn stones   RADIOACTIVE SEED IMPLANT N/A 10/30/2022   Procedure: RADIOACTIVE SEED IMPLANT/BRACHYTHERAPY IMPLANT;  Surgeon: Sebastian Ache, MD;  [facility];  Service: Urology;  Laterality: N/A;  90 MINS   SPACE OAR  INSTILLATION N/A 10/30/2022   Procedure: SPACE OAR INSTILLATION;  Surgeon: Sebastian Ache, MD;  Location: [facility];  Service: Urology;  Laterality: N/A;   Allergies Allergies  Allergen Reactions   Aspirin Hives and Rash   Home Medications    Prior to Admission medications   Medication Sig Start Date End Date Taking? Authorizing Provider  acetaminophen (TYLENOL) 500 MG tablet Take 1,000 mg by mouth as needed for moderate pain.     [provider]  amLODipine (NORVASC) 10 MG tablet Take 10 mg by mouth every evening.    [provider]  carvedilol (COREG) 25 MG tablet TAKE 1 TAB (25 MG TOTAL) BY MOUTH TWICE A DAY WITH MEALS 11/06/22   Jerre Simon, MD  clopidogrel (PLAVIX) 75 MG tablet Take 75 mg by mouth daily.    [provider]  empagliflozin (JARDIANCE) 10 MG TABS tablet TAKE 1 TABLET BY MOUTH DAILY BEFORE BREAKFAST. 04/08/23   Orbie Pyo, MD  glipiZIDE (GLUCOTROL) 10 MG tablet Take 20 mg by mouth daily before breakfast. 06/22/18   [provider]  insulin degludec (TRESIBA) 200 UNIT/ML FlexTouch Pen Inject 90 Units into the skin daily. 04/13/22   [provider]  losartan (COZAAR) 25 MG tablet Take 1 tablet (25 mg total) by mouth daily. Patient taking differently: Take 25 mg by mouth daily. 10/21/22   Orbie Pyo, MD  metFORMIN (GLUCOPHAGE) 1000 MG tablet Take 1,000 mg by mouth 2 (two) times daily with a meal.    [provider]  potassium chloride SA (KLOR-CON M) 20 MEQ tablet Take 1 tablet (20 mEq total) by mouth daily. Patient taking differently: Take 20 mEq by mouth daily. 10/19/22   Jerre Simon, MD  rosuvastatin (CRESTOR) 10 MG tablet TAKE 1 TABLET BY MOUTH EVERY DAY 03/15/23   Orbie Pyo, MD  Semaglutide,0.25 or 0.5MG /DOS, (OZEMPIC, 0.25 OR 0.5 MG/DOSE,) 2 MG/1.5ML SOPN Inject 0.25 mg into the skin once a week.    [provider]  spironolactone (ALDACTONE) 25 MG tablet Take 1 tablet (25 mg total) by mouth daily. Patient taking differently: Take 25 mg by mouth daily. 10/19/22   Jerre Simon, MD  tamsulosin (FLOMAX) 0.4 MG CAPS capsule Take 1 capsule (0.4 mg total) by mouth daily as needed. For urinary urgency after prostate radiation Patient taking differently: Take 0.4 mg by mouth 2 (two) times daily.  Pt takes one in the morning and one in the evening 10/30/22   Manny, Delbert Phenix., MD  torsemide (DEMADEX) 20 MG tablet Take 2 tablets (40 mg  total) by mouth daily. Patient taking differently: Take 40 mg by mouth daily. 10/19/22 01/04/23  Jerre Simon, MD    Physical Exam    Vital Signs:  Travis Deleon does not have vital signs available for review today.  Given telephonic nature of communication, physical exam is limited. AAOx3. NAD. Normal affect.  Speech and respirations are unlabored.  Accessory Clinical Findings  None Assessment & Plan    1.  Preoperative Cardiovascular Risk Assessment:  Mr. Travis Deleon perioperative risk of a major cardiac event is 6.6% according to the Revised Cardiac Risk Index (RCRI).  Therefore, he is at high risk for perioperative complications.   His functional capacity is good at 7.99 METs according to the Duke Activity Status Index (DASI). Recommendations: According to ACC/AHA guidelines, no further cardiovascular testing needed.  The patient may proceed to surgery at acceptable risk.    Per office protocol he may hold Plavix for  5 days prior to procedure. Please resume Plavix as soon as possible postprocedure, at the discretion of the surgeon.   The patient was advised that if he develops new symptoms prior to surgery to contact our office to arrange for a follow-up visit, and he verbalized understanding.  A copy of this note will be routed to requesting surgeon.  Time:   Today, I have spent 5 minutes with the patient with telehealth technology discussing medical history, symptoms, and management plan.    Rip Harbour, NP  05/05/2023, 7:49 AM

## 2023-05-26 ENCOUNTER — Other Ambulatory Visit: Payer: Self-pay | Admitting: Student

## 2023-06-24 NOTE — Progress Notes (Addendum)
Erroneous entry

## 2023-08-25 ENCOUNTER — Encounter: Payer: Self-pay | Admitting: Internal Medicine

## 2023-08-25 ENCOUNTER — Ambulatory Visit: Payer: Managed Care, Other (non HMO) | Attending: Internal Medicine | Admitting: Internal Medicine

## 2023-08-25 VITALS — BP 118/66 | HR 59 | Wt 269.4 lb

## 2023-08-25 DIAGNOSIS — E1159 Type 2 diabetes mellitus with other circulatory complications: Secondary | ICD-10-CM

## 2023-08-25 DIAGNOSIS — I5032 Chronic diastolic (congestive) heart failure: Secondary | ICD-10-CM | POA: Diagnosis not present

## 2023-08-25 DIAGNOSIS — E1169 Type 2 diabetes mellitus with other specified complication: Secondary | ICD-10-CM

## 2023-08-25 DIAGNOSIS — Z6841 Body Mass Index (BMI) 40.0 and over, adult: Secondary | ICD-10-CM

## 2023-08-25 DIAGNOSIS — I152 Hypertension secondary to endocrine disorders: Secondary | ICD-10-CM

## 2023-08-25 DIAGNOSIS — E119 Type 2 diabetes mellitus without complications: Secondary | ICD-10-CM

## 2023-08-25 DIAGNOSIS — Z794 Long term (current) use of insulin: Secondary | ICD-10-CM

## 2023-08-25 DIAGNOSIS — E785 Hyperlipidemia, unspecified: Secondary | ICD-10-CM

## 2023-08-25 NOTE — Progress Notes (Signed)
 Cardiology Office Note:   Date:  08/25/2023  ID:  Travis Deleon, DOB 1960/10/23, MRN [id number] PCP:  Sophronia Ozell BROCKS, MD  [facility] Providers Cardiologist:  Wendel Haws, MD Referring MD: Sophronia Ozell BROCKS, MD  Chief Complaint/Reason for Referral: Chronic diastolic heart failure ASSESSMENT:    1. Chronic diastolic congestive heart failure (HCC)   2. Type 2 diabetes mellitus without complication, with long-term current use of insulin  (HCC)   3. Hypertension associated with diabetes (HCC)   4. Hyperlipidemia associated with type 2 diabetes mellitus (HCC)   5. BMI 40.0-44.9, adult (HCC)     PLAN:   In order of problems listed above: Chronic diastolic heart failure: Continue Jardiance  10, losartan  25, spironolactone  25, and Coreg  25. Type 2 diabetes mellitus: Continue Plavix  in lieu of aspirin, losartan , Crestor , Jardiance , and Ozempic. Hypertension: Blood pressure is well-controlled today. Hyperlipidemia: Check lipid panel and LFTs today. Elevated BMI: Continue Ozempic.            Dispo:  Return in about 1 year (around 08/24/2024).      Medication Adjustments/Labs and Tests Ordered: Current medicines are reviewed at length with the patient today.  Concerns regarding medicines are outlined above.  The following changes have been made:  no change   Labs/tests ordered: Orders Placed This Encounter  Procedures   Hepatic function panel   Lipid panel   EKG 12-Lead    Medication Changes: No orders of the defined types were placed in this encounter.   Current medicines are reviewed at length with the patient today.  The patient does not have concerns regarding medicines.  I spent 35 minutes reviewing all clinical data during and prior to this visit including all relevant imaging studies, laboratories, clinical information from other health systems and prior notes from both Cardiology and other specialties, interviewing the patient, conducting a complete physical  examination, and coordinating care in order to formulate a comprehensive and personalized evaluation and treatment plan.   History of Present Illness:      FOCUSED PROBLEM LIST:   Type II DM On insulin  Hypertension Hyperlipidemia LP(a) 142 BMI 40 Prostate cancer Followed by radiation oncology Solitary kidney GFR greater than 60 Aspirin allergy On Plavix  instead given history of diabetes mellitus Diastolic dysfunction EF 60 to 34%, GD2 diastolic dysfunction TTE 2023 OSA On CPAP  3/24:  The patient is a 63 y.o. male with the indicated medical history here for hospital follow-up.  The patient was admitted recently with acute on chronic diastolic heart failure exacerbation.  BNP was found to be elevated.  His troponins were mildly elevated with no delta.  He was diuresed and discharged on a regimen including spironolactone , carvedilol , lisinopril, and torsemide .   Prior to being admitted to the hospital the patient had contracted pneumonia.  He had his hydrochlorothiazide stopped around that time as well.  He noticed increasing shortness of breath with the pneumonia.  Following this he then developed orthopnea, paroxysmal nocturnal dyspnea, and dyspnea on exertion.  Prior to this he had no issues with angina, dyspnea, paroxysmal atrial dyspnea, orthopnea.  Right now he feels very well and denies any cardiovascular symptoms besides from some mild peripheral edema.   He is to undergo implantation of radioactive seeds for treatment of his prostate cancer relatively soon.  Plan: Start Jardiance  10 mg daily, change lisinopril to losartan , start Plavix  after prostate surgery; check LP(a); proceed to urologic procedure.  1/25: In the interim the patient's LP(a) was found to be elevated.  His  LDL however was 50.  No changes were made to his regimen.  He underwent urologic surgery and colonoscopy without issues.  The patient feels very well.  He denies any cardiovascular complaints today.  He has  been completely compliant with his medications.  He denies any paroxysmal nocturnal dyspnea or orthopnea.  He has had no issues with Plavix .  He has been losing a little bit of weight on Ozempic and will be increasing the dose to 0.5 mg.  He is otherwise well fairly happy with how he feels.  He is trying to walk a few times a week with his family.  He works from home and is mostly at a computer.        Current Medications: Current Meds  Medication Sig   acetaminophen  (TYLENOL ) 500 MG tablet Take 1,000 mg by mouth as needed for moderate pain.   amLODipine  (NORVASC ) 10 MG tablet Take 10 mg by mouth every evening.   carvedilol  (COREG ) 25 MG tablet TAKE 1 TAB (25 MG TOTAL) BY MOUTH TWICE A DAY WITH MEALS   clopidogrel  (PLAVIX ) 75 MG tablet Take 75 mg by mouth daily.   empagliflozin  (JARDIANCE ) 10 MG TABS tablet TAKE 1 TABLET BY MOUTH DAILY BEFORE BREAKFAST.   glipiZIDE (GLUCOTROL) 10 MG tablet Take 20 mg by mouth daily before breakfast.   insulin  degludec (TRESIBA) 200 UNIT/ML FlexTouch Pen Inject 90 Units into the skin daily.   losartan  (COZAAR ) 25 MG tablet Take 1 tablet (25 mg total) by mouth daily. (Patient taking differently: Take 25 mg by mouth daily.)   metFORMIN (GLUCOPHAGE) 1000 MG tablet Take 1,000 mg by mouth 2 (two) times daily with a meal.   potassium chloride  SA (KLOR-CON  M) 20 MEQ tablet Take 1 tablet (20 mEq total) by mouth daily. (Patient taking differently: Take 20 mEq by mouth daily.)   rosuvastatin  (CRESTOR ) 10 MG tablet TAKE 1 TABLET BY MOUTH EVERY DAY   Semaglutide,0.25 or 0.5MG /DOS, 2 MG/3ML SOPN Inject 0.5 mg into the skin once a week.   solifenacin (VESICARE) 10 MG tablet Take 10 mg by mouth daily.   spironolactone  (ALDACTONE ) 25 MG tablet Take 1 tablet (25 mg total) by mouth daily. (Patient taking differently: Take 25 mg by mouth daily.)   tamsulosin  (FLOMAX ) 0.4 MG CAPS capsule Take 1 capsule (0.4 mg total) by mouth daily as needed. For urinary urgency after prostate  radiation (Patient taking differently: Take 0.4 mg by mouth 2 (two) times daily.  Pt takes one in the morning and one in the evening)   Vitamin D, Ergocalciferol, (DRISDOL) 1.25 MG (50000 UNIT) CAPS capsule Take 50,000 Units by mouth once a week.     Review of Systems:   Please see the history of present illness.    All other systems reviewed and are negative.     EKGs/Labs/Other Test Reviewed:   EKG:  EKG performed March 2023 that I personally reviewed demonstrates sinus rhythm with PVCs and nonspecific ST and T wave changes.   EKG Interpretation Date/Time:  Wednesday August 25 2023 14:03:39 EST Ventricular Rate:  53 PR Interval:  170 QRS Duration:  90 QT Interval:  448 QTC Calculation: 420 R Axis:   -7  Text Interpretation: Sinus bradycardia When compared with ECG of 18-Oct-2022 10:46, Premature ventricular complexes are no longer Present T wave inversion now evident in Inferior leads Nonspecific T wave abnormality no longer evident in Lateral leads Confirmed by Wendel Haws ([id number]) on 08/25/2023 2:08:25 PM  Risk Assessment/Calculations:          Physical Exam:   VS:  BP 118/66   Pulse (!) 59   Wt 269 lb 6.4 oz (122.2 kg)   SpO2 98%   BMI 39.78 kg/m        Wt Readings from Last 3 Encounters:  08/25/23 269 lb 6.4 oz (122.2 kg)  11/12/22 272 lb 3.2 oz (123.5 kg)  10/30/22 271 lb 12.8 oz (123.3 kg)      GENERAL:  No apparent distress, AOx3 HEENT:  No carotid bruits, +2 carotid impulses, no scleral icterus CAR: RRR  no murmurs, gallops, rubs, or thrills RES:  Clear to auscultation bilaterally ABD:  Soft, nontender, nondistended, positive bowel sounds x 4 VASC:  +2 radial pulses, +2 carotid pulses NEURO:  CN 2-12 grossly intact; motor and sensory grossly intact PSYCH:  No active depression or anxiety EXT:  No edema, ecchymosis, or cyanosis  Signed, Liyana Suniga K Kathie Posa, MD  08/25/2023 3:31 PM    [facility] [address] [place], [place],  [place]  [address] Phone: [phone]; Fax: [phone]   Note:  This document was prepared using Dragon voice recognition software and may include unintentional dictation errors.

## 2023-08-25 NOTE — Patient Instructions (Signed)
 Medication Instructions:  No changes *If you need a refill on your cardiac medications before your next appointment, please call your pharmacy*   Lab Work: Today: go to the [facility] on first floor for blood work (lipids, liver function)  If you have labs (blood work) drawn today and your tests are completely normal, you will receive your results only by: MyChart Message (if you have MyChart) OR A paper copy in the mail If you have any lab test that is abnormal or we need to change your treatment, we will call you to review the results.   Testing/Procedures: none   Follow-Up: At [facility], you and your health needs are our priority.  As part of our continuing mission to provide you with exceptional heart care, we have created designated Provider Care Teams.  These Care Teams include your primary Cardiologist (physician) and Advanced Practice Providers (APPs -  Physician Assistants and Nurse Practitioners) who all work together to provide you with the care you need, when you need it.   Your next appointment:   12 month(s)  Provider:   Arun K Thukkani, MD     Other Instructions

## 2023-08-26 LAB — LIPID PANEL
Chol/HDL Ratio: 3.1 {ratio} (ref 0.0–5.0)
Cholesterol, Total: 106 mg/dL (ref 100–199)
HDL: 34 mg/dL — ABNORMAL LOW (ref >39)
LDL Chol Calc (NIH): 46 mg/dL (ref 0–99)
Triglycerides: 152 mg/dL — ABNORMAL HIGH (ref 0–149)
VLDL Cholesterol Cal: 26 mg/dL (ref 5–40)

## 2023-08-26 LAB — HEPATIC FUNCTION PANEL
ALT: 11 [IU]/L (ref 0–44)
AST: 14 [IU]/L (ref 0–40)
Albumin: 4 g/dL (ref 3.8–4.9)
Alkaline Phosphatase: 97 [IU]/L (ref 44–121)
Bilirubin Total: 0.4 mg/dL (ref 0.0–1.2)
Bilirubin, Direct: 0.13 mg/dL (ref 0.00–0.40)
Total Protein: 6.8 g/dL (ref 6.0–8.5)

## 2023-09-11 ENCOUNTER — Other Ambulatory Visit: Payer: Self-pay | Admitting: Internal Medicine

## 2023-09-11 DIAGNOSIS — E785 Hyperlipidemia, unspecified: Secondary | ICD-10-CM

## 2023-10-14 ENCOUNTER — Other Ambulatory Visit: Payer: Self-pay | Admitting: Internal Medicine

## 2023-10-14 DIAGNOSIS — Z6841 Body Mass Index (BMI) 40.0 and over, adult: Secondary | ICD-10-CM

## 2023-10-14 DIAGNOSIS — I152 Hypertension secondary to endocrine disorders: Secondary | ICD-10-CM

## 2023-10-14 DIAGNOSIS — E1169 Type 2 diabetes mellitus with other specified complication: Secondary | ICD-10-CM

## 2023-10-14 DIAGNOSIS — Z01810 Encounter for preprocedural cardiovascular examination: Secondary | ICD-10-CM

## 2023-10-14 DIAGNOSIS — E119 Type 2 diabetes mellitus without complications: Secondary | ICD-10-CM

## 2023-10-14 DIAGNOSIS — I5032 Chronic diastolic (congestive) heart failure: Secondary | ICD-10-CM

## 2024-08-24 ENCOUNTER — Encounter: Payer: Self-pay | Admitting: Internal Medicine
